# Patient Record
Sex: Male | Born: 1990 | Race: White | Hispanic: No | Smoking: Never smoker
Health system: Southern US, Community
[De-identification: ages and names within clinical notes are randomized; demographics above are authoritative.]

## PROBLEM LIST (undated history)

## (undated) DIAGNOSIS — J45909 Unspecified asthma, uncomplicated: Secondary | ICD-10-CM

## (undated) HISTORY — PX: HERNIA REPAIR: SHX51

## (undated) HISTORY — PX: FRACTURE SURGERY: SHX138

## (undated) HISTORY — PX: PILONIDAL CYST EXCISION: SHX744

---
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
## 2010-08-31 ENCOUNTER — Emergency Department: Payer: Self-pay | Admitting: Emergency Medicine

## 2010-09-01 ENCOUNTER — Ambulatory Visit: Payer: Self-pay | Admitting: Internal Medicine

## 2010-09-01 ENCOUNTER — Emergency Department: Payer: Self-pay | Admitting: Surgery

## 2010-09-02 ENCOUNTER — Ambulatory Visit: Payer: Self-pay | Admitting: Internal Medicine

## 2011-12-14 ENCOUNTER — Emergency Department: Payer: Self-pay | Admitting: Emergency Medicine

## 2011-12-14 LAB — URINALYSIS, COMPLETE
Bacteria: NONE SEEN
Blood: NEGATIVE
Glucose,UR: NEGATIVE mg/dL (ref 0–75)
Nitrite: NEGATIVE
Protein: 30
Specific Gravity: 1.03 (ref 1.003–1.030)
Squamous Epithelial: <1
WBC UR: 1 /HPF (ref 0–5)

## 2011-12-14 LAB — LIPASE, BLOOD: Lipase: 63 U/L — ABNORMAL LOW (ref 73–393)

## 2011-12-14 LAB — COMPREHENSIVE METABOLIC PANEL
Albumin: 4.3 g/dL (ref 3.4–5.0)
Anion Gap: 9 (ref 7–16)
BUN: 11 mg/dL (ref 7–18)
Calcium, Total: 9.2 mg/dL (ref 8.5–10.1)
Chloride: 105 mmol/L (ref 98–107)
EGFR (African American): >60
EGFR (Non-African Amer.): >60
Glucose: 104 mg/dL — ABNORMAL HIGH (ref 65–99)
Potassium: 4.3 mmol/L (ref 3.5–5.1)
SGOT(AST): 32 U/L (ref 15–37)
Sodium: 139 mmol/L (ref 136–145)

## 2011-12-14 LAB — CBC
HCT: 48.8 % (ref 40.0–52.0)
HGB: 16.4 g/dL (ref 13.0–18.0)
MCHC: 33.6 g/dL (ref 32.0–36.0)
MCV: 93 fL (ref 80–100)
Platelet: 379 10*3/uL (ref 150–440)
RBC: 5.25 10*6/uL (ref 4.40–5.90)
RDW: 13.7 % (ref 11.5–14.5)
WBC: 12.6 10*3/uL — ABNORMAL HIGH (ref 3.8–10.6)

## 2011-12-20 LAB — CULTURE, BLOOD (SINGLE)

## 2012-01-22 ENCOUNTER — Ambulatory Visit: Payer: Self-pay | Admitting: Family Medicine

## 2012-07-22 ENCOUNTER — Emergency Department: Payer: Self-pay | Admitting: Emergency Medicine

## 2013-04-14 ENCOUNTER — Ambulatory Visit: Payer: Self-pay | Admitting: Emergency Medicine

## 2013-04-14 LAB — URINALYSIS, COMPLETE
BILIRUBIN, UR: NEGATIVE
Blood: NEGATIVE
Glucose,UR: NEGATIVE mg/dL (ref 0–75)
KETONE: NEGATIVE
Leukocyte Esterase: NEGATIVE
Nitrite: NEGATIVE
Ph: 7 (ref 4.5–8.0)
Specific Gravity: 1.015 (ref 1.003–1.030)
Squamous Epithelial: NONE SEEN

## 2013-06-08 ENCOUNTER — Ambulatory Visit: Payer: Self-pay

## 2013-12-23 ENCOUNTER — Ambulatory Visit: Payer: Self-pay | Admitting: Podiatry

## 2014-01-04 ENCOUNTER — Ambulatory Visit (INDEPENDENT_AMBULATORY_CARE_PROVIDER_SITE_OTHER): Payer: BLUE CROSS/BLUE SHIELD | Admitting: Podiatry

## 2014-01-04 ENCOUNTER — Ambulatory Visit: Payer: Self-pay | Admitting: Podiatry

## 2014-01-04 ENCOUNTER — Encounter: Payer: Self-pay | Admitting: Podiatry

## 2014-01-04 VITALS — BP 133/83 | HR 101 | Resp 16

## 2014-01-04 DIAGNOSIS — B079 Viral wart, unspecified: Secondary | ICD-10-CM

## 2014-01-04 DIAGNOSIS — B078 Other viral warts: Secondary | ICD-10-CM

## 2014-01-04 NOTE — Progress Notes (Signed)
   Subjective:    Patient ID: Logan King, male    DOB: 31-May-1990, 24 y.o.   MRN: [id number]  HPI Comments: "I have a little wart"  Patient c/o tender plantar forefoot right for few months. Its getting larger. Sore with walking. No home treatment.      Review of Systems  Constitutional: Positive for appetite change and fatigue.  Musculoskeletal: Positive for myalgias and gait problem.  Neurological: Positive for numbness.  All other systems reviewed and are negative.      Objective:   Physical Exam        Assessment & Plan:

## 2014-01-06 NOTE — Progress Notes (Signed)
Subjective:     Patient ID: Logan King, male   DOB: [birth year], 24 y.o.   MRN: [id number]  HPI patient presents with 3 small lesions on the plantar aspect of the right foot that are painful with walking and he has not been able to apply anything at home. States that they have presented over the last several months   Review of Systems  All other systems reviewed and are negative.      Objective:   Physical Exam  Constitutional: He is oriented to person, place, and time.  Cardiovascular: Intact distal pulses.   Musculoskeletal: Normal range of motion.  Neurological: He is oriented to person, place, and time.  Skin: Skin is warm.  Nursing note and vitals reviewed.  neurovascular status intact with muscle strength adequate and noted to have 3 small lesions upon debridement that show pinpoint bleeding right with pain to lateral pressure. Patient's overall health is good     Assessment:     Verruca plantaris plantar aspect right foot    Plan:     Reviewed condition and debrided lesions fully and applied chemical and sterile dressing. Instructed what to do if any blistering should occur and reappoint 4 weeks earlier if any issues should occur

## 2014-02-01 ENCOUNTER — Ambulatory Visit: Payer: Self-pay | Admitting: Podiatry

## 2014-02-05 ENCOUNTER — Ambulatory Visit: Payer: Self-pay | Admitting: Podiatry

## 2014-06-08 ENCOUNTER — Encounter (HOSPITAL_COMMUNITY): Payer: Self-pay | Admitting: Physical Medicine and Rehabilitation

## 2014-06-08 ENCOUNTER — Emergency Department (HOSPITAL_COMMUNITY): Payer: BLUE CROSS/BLUE SHIELD

## 2014-06-08 ENCOUNTER — Emergency Department (HOSPITAL_COMMUNITY)
Admission: EM | Admit: 2014-06-08 | Discharge: 2014-06-09 | Discharge status: Against Medical Advice | Payer: BLUE CROSS/BLUE SHIELD | Source: Home / Self Care | Attending: Emergency Medicine | Admitting: Emergency Medicine

## 2014-06-08 DIAGNOSIS — F909 Attention-deficit hyperactivity disorder, unspecified type: Secondary | ICD-10-CM | POA: Diagnosis not present

## 2014-06-08 DIAGNOSIS — R0602 Shortness of breath: Secondary | ICD-10-CM

## 2014-06-08 DIAGNOSIS — R42 Dizziness and giddiness: Secondary | ICD-10-CM | POA: Insufficient documentation

## 2014-06-08 DIAGNOSIS — R079 Chest pain, unspecified: Secondary | ICD-10-CM

## 2014-06-08 DIAGNOSIS — F419 Anxiety disorder, unspecified: Secondary | ICD-10-CM | POA: Diagnosis not present

## 2014-06-08 DIAGNOSIS — R1110 Vomiting, unspecified: Secondary | ICD-10-CM

## 2014-06-08 DIAGNOSIS — R002 Palpitations: Secondary | ICD-10-CM

## 2014-06-08 DIAGNOSIS — Z79899 Other long term (current) drug therapy: Secondary | ICD-10-CM | POA: Diagnosis not present

## 2014-06-08 LAB — CBC WITH DIFFERENTIAL/PLATELET
BASOS ABS: 0 10*3/uL (ref 0.0–0.1)
BASOS PCT: 0 % (ref 0–1)
EOS ABS: 0.5 10*3/uL (ref 0.0–0.7)
Eosinophils Relative: 5 % (ref 0–5)
HCT: 41.9 % (ref 39.0–52.0)
Hemoglobin: 14.3 g/dL (ref 13.0–17.0)
LYMPHS ABS: 1.5 10*3/uL (ref 0.7–4.0)
Lymphocytes Relative: 16 % (ref 12–46)
MCH: 30.2 pg (ref 26.0–34.0)
MCHC: 34.1 g/dL (ref 30.0–36.0)
MCV: 88.4 fL (ref 78.0–100.0)
MONO ABS: 0.8 10*3/uL (ref 0.1–1.0)
MONOS PCT: 9 % (ref 3–12)
NEUTROS ABS: 6.6 10*3/uL (ref 1.7–7.7)
NEUTROS PCT: 70 % (ref 43–77)
PLATELETS: 289 10*3/uL (ref 150–400)
RBC: 4.74 MIL/uL (ref 4.22–5.81)
RDW: 12.4 % (ref 11.5–15.5)
WBC: 9.5 10*3/uL (ref 4.0–10.5)

## 2014-06-08 LAB — COMPREHENSIVE METABOLIC PANEL
ALBUMIN: 4.1 g/dL (ref 3.5–5.0)
ALK PHOS: 103 U/L (ref 38–126)
ALT: 20 U/L (ref 17–63)
AST: 21 U/L (ref 15–41)
Anion gap: 11 (ref 5–15)
BILIRUBIN TOTAL: 0.5 mg/dL (ref 0.3–1.2)
BUN: 10 mg/dL (ref 6–20)
CALCIUM: 9.4 mg/dL (ref 8.9–10.3)
CHLORIDE: 103 mmol/L (ref 101–111)
CO2: 26 mmol/L (ref 22–32)
Creatinine, Ser: 1.19 mg/dL (ref 0.61–1.24)
Glucose, Bld: 95 mg/dL (ref 65–99)
Potassium: 3.8 mmol/L (ref 3.5–5.1)
SODIUM: 140 mmol/L (ref 135–145)
Total Protein: 6.5 g/dL (ref 6.5–8.1)

## 2014-06-08 LAB — I-STAT TROPONIN, ED: TROPONIN I, POC: 0 ng/mL (ref 0.00–0.08)

## 2014-06-08 LAB — D-DIMER, QUANTITATIVE (NOT AT ARMC)

## 2014-06-08 MED ORDER — PANTOPRAZOLE SODIUM 20 MG PO TBEC: 20.0000 mg | DELAYED_RELEASE_TABLET | Freq: Every day | ORAL | Status: DC

## 2014-06-08 MED ORDER — SODIUM CHLORIDE 0.9 % IV BOLUS (SEPSIS): 1000.0000 mL | Freq: Once | INTRAVENOUS | Status: DC

## 2014-06-08 MED ORDER — GI COCKTAIL ~~LOC~~
30.0000 mL | Freq: Once | ORAL | Status: AC
  Administered 2014-06-08: 30 mL via ORAL
  Filled 2014-06-08: qty 30

## 2014-06-08 MED ORDER — IBUPROFEN 800 MG PO TABS
800.0000 mg | ORAL_TABLET | Freq: Three times a day (TID) | ORAL | Status: DC
Start: 2014-06-08 — End: 2014-06-09

## 2014-06-08 NOTE — Discharge Instructions (Signed)

## 2014-06-08 NOTE — ED Provider Notes (Signed)
CSN: [id number]     Arrival date & time 06/08/14  1812 History   First MD Initiated Contact with Patient 06/08/14 2018     Chief Complaint  Patient presents with  . Chest Pain  . Shortness of Breath    (Consider location/radiation/quality/duration/timing/severity/associated sxs/prior Treatment) HPI Comments: 24 year old male with no significant past medical history presents to the emergency department for further evaluation of chest pain. Patient states that chest pain began at 0345 this morning and has been constant since onset. Pain began shortly after waking from sleep at 3 AM. Patient describes the pain as pressure which radiates through to his back and to his b/l shoulders. He took tums for symptoms with no relief. He states that pain is worse with exertion and that when he bends over he feels like "it cuts off my air" and he cannot breathe. He reports mild SOB at rest and dizziness. Girlfriend reports some pallor initially which has resolved. He had 1 episode of emesis shortly after his pain began. No diaphoresis or syncope. No PMH of HTN, HLD, DM, PE/DVT. No FHx of sudden cardiac death or ACS. Patient endorses a PMHx of anxiety which has always been mild and never required medication. He reports drinking 2 alcoholic drinks per week. Denies marijuana and illicit drug use.  Patient is a 24 y.o. male presenting with chest pain and shortness of breath. The history is provided by the patient. No language interpreter was used.  Chest Pain Associated symptoms: dizziness, palpitations, shortness of breath and vomiting   Associated symptoms: no abdominal pain and no fever   Shortness of Breath Associated symptoms: chest pain and vomiting   Associated symptoms: no abdominal pain and no fever     History reviewed. No pertinent past medical history. History reviewed. No pertinent past surgical history. History reviewed. No pertinent family history. History  Substance Use Topics  . Smoking status:  Never Smoker   . Smokeless tobacco: Not on file  . Alcohol Use: 0.0 oz/week    0 Standard drinks or equivalent per week    Review of Systems  Constitutional: Negative for fever.  Respiratory: Positive for shortness of breath.   Cardiovascular: Positive for chest pain and palpitations.  Gastrointestinal: Positive for vomiting. Negative for abdominal pain.  Skin: Positive for pallor.  Neurological: Positive for dizziness. Negative for syncope.  All other systems reviewed and are negative.   Allergies  Review of patient's allergies indicates no known allergies.  Home Medications   Prior to Admission medications   Medication Sig Start Date End Date Taking? Authorizing Provider  ibuprofen (ADVIL,MOTRIN) 800 MG tablet Take 1 tablet (800 mg total) by mouth 3 (three) times daily. 06/08/14   Antony MaduraKelly Elver Stadler, PA-C  pantoprazole (PROTONIX) 20 MG tablet Take 1 tablet (20 mg total) by mouth daily. 06/08/14   Antony MaduraKelly Roise Emert, PA-C   BP 124/82 mmHg  Pulse 77  Temp(Src) 98.5 F (36.9 C) (Oral)  Resp 12  SpO2 98%   Physical Exam  Constitutional: He is oriented to person, place, and time. He appears well-developed and well-nourished. No distress.  Nontoxic/nonseptic appearing  HENT:  Head: Normocephalic and atraumatic.  Eyes: Conjunctivae and EOM are normal. No scleral icterus.  Neck: Normal range of motion.  No JVD  Cardiovascular: Normal rate, regular rhythm and intact distal pulses.   Pulmonary/Chest: Effort normal and breath sounds normal. No respiratory distress. He has no wheezes. He has no rales.  Respirations even and unlabored  Abdominal: Soft. He exhibits no distension.  There is tenderness. There is no rebound and no guarding.  Mild tenderness to palpation in the epigastric abdomen  Musculoskeletal: Normal range of motion.  Neurological: He is alert and oriented to person, place, and time. He exhibits normal muscle tone. Coordination normal.  GCS 15. Patient moves extremities without  ataxia.  Skin: Skin is warm and dry. No rash noted. He is not diaphoretic. No erythema. No pallor.  Psychiatric: He has a normal mood and affect. His behavior is normal.  Nursing note and vitals reviewed.   ED Course  Procedures (including critical care time) Labs Review Labs Reviewed  CBC WITH DIFFERENTIAL/PLATELET  COMPREHENSIVE METABOLIC PANEL  D-DIMER, QUANTITATIVE (NOT AT [facility])  BRAIN NATRIURETIC PEPTIDE  I-STAT TROPOININ, ED    Imaging Review Dg Chest 2 View  06/08/2014   CLINICAL DATA:  Chest pain and shortness of breath today.  EXAM: CHEST  2 VIEW  COMPARISON:  None.  FINDINGS: Normal heart size and mediastinal contours. No acute infiltrate or edema. No effusion or pneumothorax. No acute osseous findings.  IMPRESSION: Negative chest.   Electronically Signed   By: Marnee Spring M.D.   On: 06/08/2014 19:41     EKG Interpretation   Date/Time:  Tuesday June 08 2014 18:19:35 EDT Ventricular Rate:  101 PR Interval:  114 QRS Duration: 92 QT Interval:  322 QTC Calculation: 417 R Axis:   86 Text Interpretation:  Sinus tachycardia Otherwise normal ECG s1q3t3  pattern seen No old tracing to compare Confirmed by Rhunette Croft, MD, Janey Genta  [phone]) on 06/08/2014 9:35:31 PM      MDM   Final diagnoses:  Chest pain, unspecified chest pain type  Chest pain    24 year old male presents to the emergency department for further evaluation of chest pain. Chest pain began at 0345 this morning and has been constant throughout the day. Patient has no risk factors for ACS. No family history of ACS. His cardiac workup today is reassuring with a negative troponin and nonischemic EKG; troponin obtained 15 hours following onset of constant chest pain. There is evidence to suggest potential right heart strain with S1Q3T3 on EKG; however dimer today is negative and patient exhibits no tachypnea, dyspnea, or hypoxia. No risk factors for PE. Doubt dissection. CXR reviewed by myself; negative for PTX, PNA,  free air, or evidence of mediastinal widening.  Patient with no improvement in symptoms with GI cocktail. Question MSK etiology vs anxiety as patient does report some history of this. Pericarditis considered; no diffuse ST elevation on EKG, but will cover with high dose NSAIDs should this be contributing to patient's symptoms today.  Patient seen and evaluated in the emergency department by my attending, Dr. Rhunette Croft. Dr. Rhunette Croft recommended further evaluation with CT angiogram given concerning story surrounding patient's chest pain. Patient initially agreed to this test, later declining. Patient with the capacity to make this decision, understanding the risks associated with an incomplete work up. He was discharged AMA at this time. Referral given to [facility] cardiology for further follow-up, especially if symptoms persist.   Filed Vitals:   06/08/14 1827 06/09/14 0019  BP: 123/71 124/82  Pulse: 96 77  Temp: 98.6 F (37 C) 98.5 F (36.9 C)  TempSrc: Oral Oral  Resp: 20 12  SpO2: 97% 98%      Antony Madura, PA-C 06/09/14 0347  Derwood Kaplan, MD 06/10/14 [phone]

## 2014-06-08 NOTE — ED Notes (Signed)
Pt presents to department for evaluation of midsternal chest pressure radiating to both arms. 5/10 pain upon arrival to ED. Pt reports he became sweaty, nauseated and felt like passing out when chest pain started. Respirations unlabored. Pt is alert and oriented x4.

## 2014-06-09 ENCOUNTER — Observation Stay (HOSPITAL_COMMUNITY)
Admission: EM | Admit: 2014-06-09 | Discharge: 2014-06-10 | Disposition: A | Discharge status: Home / Self Care | Payer: BLUE CROSS/BLUE SHIELD | Attending: Internal Medicine | Admitting: Internal Medicine

## 2014-06-09 ENCOUNTER — Emergency Department (HOSPITAL_COMMUNITY): Payer: BLUE CROSS/BLUE SHIELD

## 2014-06-09 ENCOUNTER — Encounter (HOSPITAL_COMMUNITY): Payer: Self-pay | Admitting: *Deleted

## 2014-06-09 DIAGNOSIS — F1290 Cannabis use, unspecified, uncomplicated: Secondary | ICD-10-CM

## 2014-06-09 DIAGNOSIS — R002 Palpitations: Secondary | ICD-10-CM | POA: Insufficient documentation

## 2014-06-09 DIAGNOSIS — R079 Chest pain, unspecified: Secondary | ICD-10-CM | POA: Diagnosis not present

## 2014-06-09 DIAGNOSIS — R0781 Pleurodynia: Secondary | ICD-10-CM | POA: Diagnosis not present

## 2014-06-09 DIAGNOSIS — F419 Anxiety disorder, unspecified: Secondary | ICD-10-CM | POA: Insufficient documentation

## 2014-06-09 DIAGNOSIS — Z79899 Other long term (current) drug therapy: Secondary | ICD-10-CM | POA: Insufficient documentation

## 2014-06-09 DIAGNOSIS — F909 Attention-deficit hyperactivity disorder, unspecified type: Secondary | ICD-10-CM | POA: Insufficient documentation

## 2014-06-09 LAB — I-STAT TROPONIN, ED: Troponin i, poc: 0 ng/mL (ref 0.00–0.08)

## 2014-06-09 LAB — I-STAT CHEM 8, ED
BUN: 16 mg/dL (ref 6–20)
CHLORIDE: 103 mmol/L (ref 101–111)
CREATININE: 1.1 mg/dL (ref 0.61–1.24)
Calcium, Ion: 1.19 mmol/L (ref 1.12–1.23)
Glucose, Bld: 102 mg/dL — ABNORMAL HIGH (ref 65–99)
HCT: 48 % (ref 39.0–52.0)
Hemoglobin: 16.3 g/dL (ref 13.0–17.0)
Potassium: 3.6 mmol/L (ref 3.5–5.1)
Sodium: 141 mmol/L (ref 135–145)
TCO2: 22 mmol/L (ref 0–100)

## 2014-06-09 LAB — RAPID URINE DRUG SCREEN, HOSP PERFORMED
AMPHETAMINES: NOT DETECTED
Barbiturates: NOT DETECTED
Benzodiazepines: NOT DETECTED
COCAINE: NOT DETECTED
OPIATES: NOT DETECTED
Tetrahydrocannabinol: POSITIVE — AB

## 2014-06-09 LAB — BRAIN NATRIURETIC PEPTIDE: B NATRIURETIC PEPTIDE 5: 47.3 pg/mL (ref 0.0–100.0)

## 2014-06-09 MED ORDER — AMPHETAMINE-DEXTROAMPHETAMINE 10 MG PO TABS
5.0000 mg | ORAL_TABLET | Freq: Every day | ORAL | Status: DC
  Administered 2014-06-10: 5 mg via ORAL
  Filled 2014-06-09: qty 1

## 2014-06-09 MED ORDER — ONDANSETRON HCL 4 MG/2ML IJ SOLN: 4.0000 mg | Freq: Four times a day (QID) | INTRAMUSCULAR | Status: DC | PRN

## 2014-06-09 MED ORDER — ACETAMINOPHEN 325 MG PO TABS: 650.0000 mg | ORAL_TABLET | ORAL | Status: DC | PRN

## 2014-06-09 MED ORDER — ASPIRIN 81 MG PO CHEW
324.0000 mg | CHEWABLE_TABLET | Freq: Once | ORAL | Status: AC
  Administered 2014-06-09: 324 mg via ORAL
  Filled 2014-06-09: qty 4

## 2014-06-09 MED ORDER — HEPARIN SODIUM (PORCINE) 5000 UNIT/ML IJ SOLN
5000.0000 [IU] | Freq: Three times a day (TID) | INTRAMUSCULAR | Status: DC
  Filled 2014-06-09: qty 1

## 2014-06-09 MED ORDER — MORPHINE SULFATE 2 MG/ML IJ SOLN
2.0000 mg | Freq: Once | INTRAMUSCULAR | Status: AC
  Administered 2014-06-09: 2 mg via INTRAVENOUS
  Filled 2014-06-09: qty 1

## 2014-06-09 MED ORDER — ASPIRIN 325 MG PO TABS
325.0000 mg | ORAL_TABLET | Freq: Every day | ORAL | Status: DC
  Administered 2014-06-10: 325 mg via ORAL
  Filled 2014-06-09: qty 1

## 2014-06-09 MED ORDER — MORPHINE SULFATE 2 MG/ML IJ SOLN
1.0000 mg | INTRAMUSCULAR | Status: DC | PRN
  Administered 2014-06-09 – 2014-06-10 (×4): 1 mg via INTRAVENOUS
  Filled 2014-06-09 (×4): qty 1

## 2014-06-09 MED ORDER — GI COCKTAIL ~~LOC~~
30.0000 mL | Freq: Four times a day (QID) | ORAL | Status: DC | PRN
  Administered 2014-06-09: 30 mL via ORAL
  Filled 2014-06-09: qty 30

## 2014-06-09 MED ORDER — COLCHICINE 0.6 MG PO TABS
0.6000 mg | ORAL_TABLET | Freq: Two times a day (BID) | ORAL | Status: DC
  Administered 2014-06-09 – 2014-06-10 (×2): 0.6 mg via ORAL
  Filled 2014-06-09 (×2): qty 1

## 2014-06-09 NOTE — H&P (Signed)
[facility] History and Physical  Logan King WGY:[id number] DOB: 12-10-1990 DOA: 06/09/2014   PCP: Does not have a PCP  Specialists: None  Chief Complaint: Chest pain since yesterday  HPI: Logan King is a 24 y.o. male with a past medical history that is unremarkable, who was in his usual state of health yesterday morning when he started developing chest pain in the center of his chest and to the left. There are 2 components to the pain, dull pain as well as a sharp pain. The dull pain was persistent, continuous and the sharp pain would come and go. Would increase with breathing in deep. At its worst the pain was 8 out of 10 in intensity. He denies any cough. No fever, no nausea, vomiting. No recent illness. He did get lightheaded. No syncopal episodes. Had noticed palpitations. Did get short of breath as well. He's never had similar symptoms before. He denies any cocaine use currently, although he did use marijuana about 2 days ago. Denies previous such symptoms. Currently, pain is 5-10 in intensity.  Home Medications: Prior to Admission medications   Medication Sig Start Date End Date Taking? Authorizing Provider  amphetamine-dextroamphetamine (ADDERALL) 10 MG tablet Take 5 mg by mouth daily.  03/30/14  Yes Historical Provider, MD    Allergies: No Known Allergies  Past Medical History: History reviewed. No pertinent past medical history.  Past Surgical History  Procedure Laterality Date  . Hernia repair    . Pilonidal cyst excision      Social History: He lives in [place] with his fiance. He works for [employer]. He denies any history of smoking or alcohol use. He used to do cocaine many months ago, but none recently. Did do marijuana 2 days ago. Independent with daily activities.  Family History:  Family History  Problem Relation Age of Onset  .      denies any health problems in his family.  Review of Systems - History obtained from the patient General  ROS: negative Psychological ROS: negative Ophthalmic ROS: negative ENT ROS: negative Allergy and Immunology ROS: negative Hematological and Lymphatic ROS: negative Endocrine ROS: negative Respiratory ROS: As in history of present illness Cardiovascular ROS: As in history of present illness Gastrointestinal ROS: no abdominal pain, change in bowel habits, or black or bloody stools Genito-Urinary ROS: no dysuria, trouble voiding, or hematuria Musculoskeletal ROS: negative Neurological ROS: no TIA or stroke symptoms Dermatological ROS: negative  Physical Examination  Filed Vitals:   06/09/14 1800 06/09/14 1915 06/09/14 2000 06/09/14 2018  BP: 112/73 126/82 117/95 123/72  Pulse: 96 93 95 93  Temp:  98.3 F (36.8 C)  98.2 F (36.8 C)  TempSrc:  Oral  Oral  Resp: _0 Height:      Weight:      SpO2: 95% 98% 95% 98%    BP 123/72 mmHg  Pulse 93  Temp(Src) 98.2 F (36.8 C) (Oral)  Resp 22  Ht _1  (1.803 m)  Wt 88.451 kg (195 lb)  BMI 27.21 kg/m2  SpO2 98%  General appearance: alert, cooperative, appears stated age and no distress Head: Normocephalic, without obvious abnormality, atraumatic Eyes: conjunctivae/corneas clear. PERRL, EOM's intact. Neck is soft and supple. No thyromegaly. Throat: lips, mucosa, and tongue normal; teeth and gums normal Resp: clear to auscultation bilaterally Cardio: regular rate and rhythm, S1, S2 normal, no murmur, click, rub or gallop GI: soft, non-tender; bowel sounds normal; no masses,  no organomegaly Extremities: extremities normal, atraumatic,  no cyanosis or edema Pulses: 2+ and symmetric Skin: Skin color, texture, turgor normal. No rashes or lesions Lymph nodes: Cervical, supraclavicular, and axillary nodes normal. Neurologic: No focal deficits  Laboratory Data: Results for orders placed or performed during the hospital encounter of 06/09/14 (from the past 48 hour(s))  I-Stat Chem 8, ED     Status: Abnormal   Collection  Time: 06/09/14  2:43 PM  Result Value Ref Range   Sodium 141 135 - 145 mmol/L   Potassium 3.6 3.5 - 5.1 mmol/L   Chloride 103 101 - 111 mmol/L   BUN 16 6 - 20 mg/dL   Creatinine, Ser 1.10 0.61 - 1.24 mg/dL   Glucose, Bld 102 (H) 65 - 99 mg/dL   Calcium, Ion 1.19 1.12 - 1.23 mmol/L   TCO2 22 0 - 100 mmol/L   Hemoglobin 16.3 13.0 - 17.0 g/dL   HCT 48.0 39.0 - 52.0 %  I-stat troponin, ED     Status: None   Collection Time: 06/09/14  4:39 PM  Result Value Ref Range   Troponin i, poc 0.00 0.00 - 0.08 ng/mL   Comment 3            Comment: Due to the release kinetics of cTnI, a negative result within the first hours of the onset of symptoms does not rule out myocardial infarction with certainty. If myocardial infarction is still suspected, repeat the test at appropriate intervals.   Urine rapid drug screen (hosp performed)not at [facility]     Status: Abnormal   Collection Time: 06/09/14  8:00 PM  Result Value Ref Range   Opiates NONE DETECTED NONE DETECTED   Cocaine NONE DETECTED NONE DETECTED   Benzodiazepines NONE DETECTED NONE DETECTED   Amphetamines NONE DETECTED NONE DETECTED   Tetrahydrocannabinol POSITIVE (A) NONE DETECTED   Barbiturates NONE DETECTED NONE DETECTED    Comment:        DRUG SCREEN FOR MEDICAL PURPOSES ONLY.  IF CONFIRMATION IS NEEDED FOR ANY PURPOSE, NOTIFY LAB WITHIN 5 DAYS.        LOWEST DETECTABLE LIMITS FOR URINE DRUG SCREEN Drug Class       Cutoff (ng/mL) Amphetamine      1000 Barbiturate      200 Benzodiazepine   786 Tricyclics       754 Opiates          300 Cocaine          300 THC              50     Radiology Reports: Dg Chest 2 View  06/08/2014   CLINICAL DATA:  Chest pain and shortness of breath today.  EXAM: CHEST  2 VIEW  COMPARISON:  None.  FINDINGS: Normal heart size and mediastinal contours. No acute infiltrate or edema. No effusion or pneumothorax. No acute osseous findings.  IMPRESSION: Negative chest.   Electronically Signed   By:  Monte Fantasia M.D.   On: 06/08/2014 19:41    Electrocardiogram: Sinus tachycardia at 135 beats a minute. Normal axis. Normal intervals. Possible T inversions in leads 3 and aVF. No Q waves. No concerning ST changes.  Problem List  Principal Problem:   Chest pain   Assessment: This is a 24 year old Caucasian male with a past medical history that is unremarkable, who presents with pleuritic chest pain. He does have nonspecific EKG findings. Initially was tachycardic but now heart rate is normal. D-dimer is normal. This could be pericarditis or pleurisy. Cardiology has  seen the patient and recommends echocardiogram and observation.  Plan: #1 chest pain, pleuritic: Etiology is unclear. His urine drug screen is negative for cocaine. His EKG shows nonspecific changes. D-dimer is normal. Seen by cardiology and they recommended echocardiogram and overnight observation. The also recommend colchicine, which will be initiated. We will check ESR and CRP. Repeat EKG in the morning. Continue to cycle troponins. Chest x-ray was unremarkable. Blood pressure is not elevated. Check TSH.  #2 Marijuana use: He has been told to avoid recreational drugs.   DVT Prophylaxis: Heparin subcutaneously Code Status: Full code Family Communication: Discussed with the patient and his fiance  Disposition Plan: Observe to telemetry.   Further management decisions will depend on results of further testing and patient's response to treatment.   [facility]  [facility] Pager [phone]  If 7PM-7AM, please contact night-coverage [email or web address] Password [facility]  06/09/2014, 9:27 PM

## 2014-06-09 NOTE — ED Notes (Signed)
Logan King states to give 800 mg of naproxen but pt states he has at home and does not want it

## 2014-06-09 NOTE — ED Notes (Signed)
Admitting MD at bedside.

## 2014-06-09 NOTE — ED Notes (Signed)
Pt reports seen last night for chest pain. Told to come back if it continued. HR found to be in 140's in triage. Reports pain as a pressure, reports some palpitations. Shortness of breath. States MD wanted to do CT Chest last night, but pt left and told to come back for CT if continued.

## 2014-06-09 NOTE — Consult Note (Signed)
Patient ID: Logan King MRN: [id number], DOB/AGE: [birth year]   Admit date: 06/09/2014   Primary Physician: No PCP Per Patient Primary Cardiologist: New  Pt. Profile:  24 year old male with no prior cardiac history but remote history of polysubstance abuse presenting with chest pain  Problem List  History reviewed. No pertinent past medical history.  History reviewed. No pertinent past surgical history.   Allergies  No Known Allergies  HPI  Patient is a 24 year old male with no prior cardiac history who [facility] complaints of chest pain. He does admit to a remote history of polysubstance abuse including cocaine but he denies any recent use. No history of hypertension, diabetes, hyperlipidemia or tobacco abuse. He denies any family history of CAD or sudden cardiac death. He notes past medical history significant for asthma and anxiety.  He reports a 2 day history of left-sided chest pressure radiating to the left upper extremity. Also notes associated dyspnea, diaphoresis, nausea, vomiting and palpitations. No syncope/near-syncope. Pain is pleuritic and also exertional. He denies any recent prolonged travel. No lower extremity pain or swelling. He does admit to trauma to his left upper extremity about 5 days ago when he punched a wall with his left hand. He believes he may have fractured a couple of his digits but this has not been confirmed by x-ray. However he denies any significant swelling or erythema of the left upper extremity. His EKG on arrival demonstrated sinus tachycardia with a heart rate of 135 bpm and right axis deviation. Of note, he was also seen in the emergency department last night and his EKG demonstrated sinus tach and was also concerning for s1q3t3 pattern. D-dimer was negative however it was recommended that he undergo a CT to rule out PE however the patient left AMA. Due to worsening pain he presented back for repeat evaluation. Nothing  has relieved his pain including no relief with GI cocktail. He continues to have discomfort most notably with deep inspiration. Troponin is negative. He denies any recent cocaine use. No recent excessive caffeine, alcohol or other stimulant intake.    Home Medications  Prior to Admission medications   Medication Sig Start Date End Date Taking? Authorizing Provider  amphetamine-dextroamphetamine (ADDERALL) 10 MG tablet Take 5 mg by mouth daily.  03/30/14  Yes Historical Provider, MD    Family History - negative for CAD and SCD   Social History  History   Social History  . Marital Status: Significant Other    Spouse Name: N/A  . Number of Children: N/A  . Years of Education: N/A   Occupational History  . Not on file.   Social History Main Topics  . Smoking status: Never Smoker   . Smokeless tobacco: Not on file  . Alcohol Use: 0.0 oz/week    0 Standard drinks or equivalent per week  . Drug Use: Not on file  . Sexual Activity: Not on file   Other Topics Concern  . Not on file   Social History Narrative     Review of Systems General:  No chills, fever, night sweats or weight changes.  Cardiovascular:  No chest pain, dyspnea on exertion, edema, orthopnea, palpitations, paroxysmal nocturnal dyspnea. Dermatological: No rash, lesions/masses Respiratory: No cough, dyspnea Urologic: No hematuria, dysuria Abdominal:   No nausea, vomiting, diarrhea, bright red blood per rectum, melena, or hematemesis Neurologic:  No visual changes, wkns, changes in mental status. All other systems reviewed and are otherwise negative except as noted  above.  Physical Exam  Blood pressure 100/62, pulse 76, temperature 98 F (36.7 C), temperature source Oral, resp. rate 18, height  (1.803 m), weight 195 lb (88.451 kg), SpO2 96 %.  General: Pleasant, NAD Psych: Normal affect. Neuro: Alert and oriented X 3. Moves all extremities spontaneously. HEENT: Normal  Neck: Supple without bruits  or JVD. Lungs:  Resp regular and unlabored, CTA. Heart: RRR no s3, s4, or murmurs. Abdomen: Soft, non-tender, non-distended, BS + x 4.  Extremities: No clubbing, cyanosis or edema. DP/PT/Radials 2+ and equal bilaterally.  Labs  Troponin [facility] of Care Test)  Recent Labs  06/09/14 1639  TROPIPOC 0.00   No results for input(s): CKTOTAL, CKMB, TROPONINI in the last 72 hours. Lab Results  Component Value Date   WBC 9.5 06/08/2014   HGB 16.3 06/09/2014   HCT 48.0 06/09/2014   MCV 88.4 06/08/2014   PLT 289 06/08/2014    Recent Labs Lab 06/08/14 1820 06/09/14 1443  NA 140 141  K 3.8 3.6  CL 103 103  CO2 26  --   BUN 10 16  CREATININE 1.19 1.10  CALCIUM 9.4  --   PROT 6.5  --   BILITOT 0.5  --   ALKPHOS 103  --   ALT 20  --   AST 21  --   GLUCOSE 95 102*   No results found for: CHOL, HDL, LDLCALC, TRIG Lab Results  Component Value Date   DDIMER <0.27 06/08/2014     Radiology/Studies  Dg Chest 2 View  06/08/2014   CLINICAL DATA:  Chest pain and shortness of breath today.  EXAM: CHEST  2 VIEW  COMPARISON:  None.  FINDINGS: Normal heart size and mediastinal contours. No acute infiltrate or edema. No effusion or pneumothorax. No acute osseous findings.  IMPRESSION: Negative chest.   Electronically Signed   By: Marnee Spring M.D.   On: 06/08/2014 19:41    ECG  Sinus tach; HR 135 bpm   ASSESSMENT AND PLAN  1. Chest pain: Given his age and little risk factors, doubtful this represents acute coronary syndrome. He denies any recent cocaine use. Troponin is negative.  He has pleuritic CP. ? Pericarditis. Recommend 2D echo and treatment with colchicine.   Signed, Robbie Lis, PA-C 06/09/2014, 5:25 PM

## 2014-06-09 NOTE — ED Provider Notes (Addendum)
CSN: [id number]     Arrival date & time 06/09/14  1403 History   First MD Initiated Contact with Patient 06/09/14 1513     Chief Complaint  Patient presents with  . Chest Pain     (Consider location/radiation/quality/duration/timing/severity/associated sxs/prior Treatment) HPI Comments: Logan King is a 24 y/o male with a history of anxiety, occasional cocaine use and ADHD who presents with chest pain. Logan King says he started feeling chest pain which woke him from sleep yesterday morning.  The pain is described as severe 9/10 chest "pressure" which was worse with deep inspiration.  The pain was sudden in onset and did not radiate.  Currently the pain is worse when laying supine.  The patient denies taking any medication for pain relief.  Patient presented yesterday evening to [facility] ED and left prior to CTA for r/o of aortic dissection (patient complained of CP radiating to back and weakness in his left arm).  Patient currently denies any pain radiating to the back.  Patient received tums which did not alleviate his pain.  Patient returned this afternoon after symptoms continued to persist.  Patient says his pain improved significantly after he heard a "pop" in his chest while in route to the hospital.  Patient currently rates his chest pain as 6/10.  Patient denies history of cardiac abnormalities or arrhythmia.  Patient has a history of ADHD and anxiety and does not regularly take medication for these conditions.  Patient denies family history of sudden cardiac death.  Patient endorses history of cocaine abuse 6-8x in the past few months.  Also endorses marijuana use in the past 24 hours.  Patient drinks 12 beers on weekends.  Denies history of IV drug abuse. No tobacco use.    ROS 10 Systems reviewed and are negative for acute change except as noted in the HPI.     Patient is a 24 y.o. male presenting with chest pain. The history is provided by the patient.  Chest Pain   History  reviewed. No pertinent past medical history. History reviewed. No pertinent past surgical history. Family History  Problem Relation Age of Onset  .      History  Substance Use Topics  . Smoking status: Never Smoker   . Smokeless tobacco: Not on file  . Alcohol Use: 0.0 oz/week    0 Standard drinks or equivalent per week    Review of Systems  Cardiovascular: Positive for chest pain.  All other systems reviewed and are negative.     Allergies  Review of patient's allergies indicates no known allergies.  Home Medications   Prior to Admission medications   Medication Sig Start Date End Date Taking? Authorizing Provider  amphetamine-dextroamphetamine (ADDERALL) 10 MG tablet Take 5 mg by mouth daily.  03/30/14  Yes Historical Provider, MD   BP 112/73 mmHg  Pulse 96  Temp(Src) 98 F (36.7 C) (Oral)  Resp 18  Ht  (1.803 m)  Wt 195 lb (88.451 kg)  BMI 27.21 kg/m2  SpO2 95% Physical Exam  Constitutional: He is oriented to person, place, and time. He appears well-developed.  HENT:  Head: Normocephalic and atraumatic.  Eyes: Conjunctivae and EOM are normal. Pupils are equal, round, and reactive to light.  Neck: Normal range of motion. Neck supple.  Cardiovascular: Normal rate, regular rhythm and intact distal pulses.  Exam reveals no friction rub.   No murmur heard. Pulmonary/Chest: Effort normal and breath sounds normal.  Abdominal: Soft. Bowel sounds are normal. He  exhibits no distension. There is no tenderness. There is no rebound and no guarding.  Neurological: He is alert and oriented to person, place, and time.  Skin: Skin is warm.  Nursing note and vitals reviewed.   ED Course  Procedures (including critical care time) Labs Review Labs Reviewed  I-STAT CHEM 8, ED - Abnormal; Notable for the following:    Glucose, Bld 102 (*)    All other components within normal limits  I-STAT TROPOININ, ED    Imaging Review Dg Chest 2 View  06/08/2014   CLINICAL DATA:   Chest pain and shortness of breath today.  EXAM: CHEST  2 VIEW  COMPARISON:  None.  FINDINGS: Normal heart size and mediastinal contours. No acute infiltrate or edema. No effusion or pneumothorax. No acute osseous findings.  IMPRESSION: Negative chest.   Electronically Signed   By: Marnee SpringJonathon  Watts M.D.   On: 06/08/2014 19:41     EKG Interpretation None     ED ECG REPORT   Date: 06/09/2014  Rate: 135  Rhythm: sinus tachycardia  QRS Axis: normal  Intervals: normal  ST/T Wave abnormalities: nonspecific ST/T changes, T wave inversions in the inferior leads  Conduction Disutrbances:none  Narrative Interpretation:   Old EKG Reviewed: unchanged  I have personally reviewed the EKG tracing and agree with the computerized printout as noted.   MDM   Final diagnoses:  Chest pain, unspecified chest pain type  Palpitations    Pt with atypical chest pain. Has cocaine hx, and is having some palpitations. Pain is atypical - positional, not exertional. Ddx: Pericarditis, Myocarditis, Dissection, ACS. Pt had a neg dimer yday - which effectively makes PE and Dissection highly unlikely Will get Cards to see him today - 2nd er visit for the same with neg trops already.   Derwood KaplanAnkit Heddy Vidana, MD 06/09/14 1732  6:26 PM Cards recommend obs admission with Tele, and an echo.   Derwood KaplanAnkit Rishi Vicario, MD 06/09/14 [id number]

## 2014-06-10 ENCOUNTER — Ambulatory Visit (HOSPITAL_COMMUNITY): Payer: BLUE CROSS/BLUE SHIELD

## 2014-06-10 DIAGNOSIS — F1220 Cannabis dependence, uncomplicated: Secondary | ICD-10-CM | POA: Diagnosis not present

## 2014-06-10 DIAGNOSIS — R079 Chest pain, unspecified: Secondary | ICD-10-CM

## 2014-06-10 DIAGNOSIS — R0789 Other chest pain: Secondary | ICD-10-CM | POA: Diagnosis not present

## 2014-06-10 DIAGNOSIS — F1290 Cannabis use, unspecified, uncomplicated: Secondary | ICD-10-CM

## 2014-06-10 DIAGNOSIS — F419 Anxiety disorder, unspecified: Secondary | ICD-10-CM | POA: Diagnosis not present

## 2014-06-10 DIAGNOSIS — F909 Attention-deficit hyperactivity disorder, unspecified type: Secondary | ICD-10-CM | POA: Diagnosis not present

## 2014-06-10 DIAGNOSIS — R002 Palpitations: Secondary | ICD-10-CM | POA: Diagnosis not present

## 2014-06-10 LAB — COMPREHENSIVE METABOLIC PANEL WITH GFR
ALT: 20 U/L (ref 17–63)
AST: 19 U/L (ref 15–41)
Albumin: 3.6 g/dL (ref 3.5–5.0)
Alkaline Phosphatase: 94 U/L (ref 38–126)
Anion gap: 6 (ref 5–15)
BUN: 12 mg/dL (ref 6–20)
CO2: 28 mmol/L (ref 22–32)
Calcium: 9.1 mg/dL (ref 8.9–10.3)
Chloride: 105 mmol/L (ref 101–111)
Creatinine, Ser: 1.1 mg/dL (ref 0.61–1.24)
GFR calc Af Amer: >60 mL/min
GFR calc non Af Amer: >60 mL/min
Glucose, Bld: 106 mg/dL — ABNORMAL HIGH (ref 65–99)
Potassium: 4 mmol/L (ref 3.5–5.1)
Sodium: 139 mmol/L (ref 135–145)
Total Bilirubin: 0.4 mg/dL (ref 0.3–1.2)
Total Protein: 5.8 g/dL — ABNORMAL LOW (ref 6.5–8.1)

## 2014-06-10 LAB — CBC
HCT: 42.8 % (ref 39.0–52.0)
Hemoglobin: 14.4 g/dL (ref 13.0–17.0)
MCH: 30.1 pg (ref 26.0–34.0)
MCHC: 33.6 g/dL (ref 30.0–36.0)
MCV: 89.4 fL (ref 78.0–100.0)
PLATELETS: 290 10*3/uL (ref 150–400)
RBC: 4.79 MIL/uL (ref 4.22–5.81)
RDW: 12.4 % (ref 11.5–15.5)
WBC: 6.3 10*3/uL (ref 4.0–10.5)

## 2014-06-10 LAB — MRSA PCR SCREENING: MRSA by PCR: NEGATIVE

## 2014-06-10 LAB — SEDIMENTATION RATE: Sed Rate: 1 mm/hr (ref 0–16)

## 2014-06-10 LAB — TROPONIN I
Troponin I: <0.03 ng/mL (ref <0.031)
Troponin I: <0.03 ng/mL (ref <0.031)

## 2014-06-10 LAB — C-REACTIVE PROTEIN: CRP: 1.9 mg/dL — AB (ref <1.0)

## 2014-06-10 LAB — TSH: TSH: 2.214 u[IU]/mL (ref 0.350–4.500)

## 2014-06-10 MED ORDER — IBUPROFEN 800 MG PO TABS
800.0000 mg | ORAL_TABLET | Freq: Three times a day (TID) | ORAL | Status: DC
  Filled 2014-06-10: qty 1

## 2014-06-10 MED ORDER — IBUPROFEN 800 MG PO TABS: 800.0000 mg | ORAL_TABLET | Freq: Three times a day (TID) | ORAL | Status: AC | PRN

## 2014-06-10 MED ORDER — COLCHICINE 0.6 MG PO TABS: 0.6000 mg | ORAL_TABLET | Freq: Two times a day (BID) | ORAL | Status: AC

## 2014-06-10 MED ORDER — IBUPROFEN 200 MG PO TABS
400.0000 mg | ORAL_TABLET | Freq: Four times a day (QID) | ORAL | Status: DC
  Administered 2014-06-10: 400 mg via ORAL

## 2014-06-10 MED ORDER — PERFLUTREN LIPID MICROSPHERE
1.0000 mL | INTRAVENOUS | Status: DC | PRN
  Administered 2014-06-10: 3 mL via INTRAVENOUS

## 2014-06-10 NOTE — Discharge Summary (Signed)
Physician Discharge Summary  Logan King MRN: [id number] DOB/AGE: 08-Jan-1990 24 y.o.  PCP: No PCP Per Patient   Admit date: 06/09/2014 Discharge date: 06/10/2014  Discharge Diagnoses:   Principal Problem:   Chest pain viral pericarditis and/or pleurisy   Marijuana smoker  Follow-up recommendations Follow-up with PCP in 3-5 days PCP requested to follow-up on the results of the 2-D echo     Medication List    TAKE these medications        amphetamine-dextroamphetamine 10 MG tablet  Commonly known as:  ADDERALL  Take 5 mg by mouth daily.     colchicine 0.6 MG tablet  Take 1 tablet (0.6 mg total) by mouth 2 (two) times daily.     ibuprofen 800 MG tablet  Commonly known as:  ADVIL,MOTRIN  Take 1 tablet (800 mg total) by mouth every 8 (eight) hours as needed.         Discharge Condition: Stable    Disposition: 01-Home or Self Care   Consults: Cardiology  Significant Diagnostic Studies:  Dg Chest 2 View  06/08/2014   CLINICAL DATA:  Chest pain and shortness of breath today.  EXAM: CHEST  2 VIEW  COMPARISON:  None.  FINDINGS: Normal heart size and mediastinal contours. No acute infiltrate or edema. No effusion or pneumothorax. No acute osseous findings.  IMPRESSION: Negative chest.   Electronically Signed   By: Monte Fantasia M.D.   On: 06/08/2014 19:41    2-D echo completed results pending   Filed Weights   06/09/14 1411 06/09/14 2217 06/10/14 0623  Weight: 88.451 kg (195 lb) 87.771 kg (193 lb 8 oz) 87.7 kg (193 lb 5.5 oz)     Microbiology: Recent Results (from the past 240 hour(s))  MRSA PCR Screening     Status: None   Collection Time: 06/09/14 10:24 PM  Result Value Ref Range Status   MRSA by PCR NEGATIVE NEGATIVE Final    Comment:        The GeneXpert MRSA Assay (FDA approved for NASAL specimens only), is one component of a comprehensive MRSA colonization surveillance program. It is not intended to diagnose MRSA infection nor to guide  or monitor treatment for MRSA infections.        Blood Culture No results found for: SDES, SPECREQUEST, CULT, REPTSTATUS    Labs: Results for orders placed or performed during the hospital encounter of 06/09/14 (from the past 48 hour(s))  I-Stat Chem 8, ED     Status: Abnormal   Collection Time: 06/09/14  2:43 PM  Result Value Ref Range   Sodium 141 135 - 145 mmol/L   Potassium 3.6 3.5 - 5.1 mmol/L   Chloride 103 101 - 111 mmol/L   BUN 16 6 - 20 mg/dL   Creatinine, Ser 1.10 0.61 - 1.24 mg/dL   Glucose, Bld 102 (H) 65 - 99 mg/dL   Calcium, Ion 1.19 1.12 - 1.23 mmol/L   TCO2 22 0 - 100 mmol/L   Hemoglobin 16.3 13.0 - 17.0 g/dL   HCT 48.0 39.0 - 52.0 %  I-stat troponin, ED     Status: None   Collection Time: 06/09/14  4:39 PM  Result Value Ref Range   Troponin i, poc 0.00 0.00 - 0.08 ng/mL   Comment 3            Comment: Due to the release kinetics of cTnI, a negative result within the first hours of the onset of symptoms does not rule out myocardial infarction with  certainty. If myocardial infarction is still suspected, repeat the test at appropriate intervals.   Urine rapid drug screen (hosp performed)not at [facility]     Status: Abnormal   Collection Time: 06/09/14  8:00 PM  Result Value Ref Range   Opiates NONE DETECTED NONE DETECTED   Cocaine NONE DETECTED NONE DETECTED   Benzodiazepines NONE DETECTED NONE DETECTED   Amphetamines NONE DETECTED NONE DETECTED   Tetrahydrocannabinol POSITIVE (A) NONE DETECTED   Barbiturates NONE DETECTED NONE DETECTED    Comment:        DRUG SCREEN FOR MEDICAL PURPOSES ONLY.  IF CONFIRMATION IS NEEDED FOR ANY PURPOSE, NOTIFY LAB WITHIN 5 DAYS.        LOWEST DETECTABLE LIMITS FOR URINE DRUG SCREEN Drug Class       Cutoff (ng/mL) Amphetamine      1000 Barbiturate      200 Benzodiazepine   779 Tricyclics       390 Opiates          300 Cocaine          300 THC              50   MRSA PCR Screening     Status: None   Collection  Time: 06/09/14 10:24 PM  Result Value Ref Range   MRSA by PCR NEGATIVE NEGATIVE    Comment:        The GeneXpert MRSA Assay (FDA approved for NASAL specimens only), is one component of a comprehensive MRSA colonization surveillance program. It is not intended to diagnose MRSA infection nor to guide or monitor treatment for MRSA infections.   C-reactive protein     Status: Abnormal   Collection Time: 06/09/14 11:10 PM  Result Value Ref Range   CRP 1.9 (H) <1.0 mg/dL  Sedimentation rate     Status: None   Collection Time: 06/09/14 11:10 PM  Result Value Ref Range   Sed Rate 1 0 - 16 mm/hr  Troponin I     Status: None   Collection Time: 06/09/14 11:10 PM  Result Value Ref Range   Troponin I <0.03 <0.031 ng/mL    Comment:        NO INDICATION OF MYOCARDIAL INJURY.   TSH     Status: None   Collection Time: 06/09/14 11:10 PM  Result Value Ref Range   TSH 2.214 0.350 - 4.500 uIU/mL  Troponin I     Status: None   Collection Time: 06/10/14  4:00 AM  Result Value Ref Range   Troponin I <0.03 <0.031 ng/mL    Comment:        NO INDICATION OF MYOCARDIAL INJURY.   CBC     Status: None   Collection Time: 06/10/14  4:00 AM  Result Value Ref Range   WBC 6.3 4.0 - 10.5 K/uL   RBC 4.79 4.22 - 5.81 MIL/uL   Hemoglobin 14.4 13.0 - 17.0 g/dL   HCT 42.8 39.0 - 52.0 %   MCV 89.4 78.0 - 100.0 fL   MCH 30.1 26.0 - 34.0 pg   MCHC 33.6 30.0 - 36.0 g/dL   RDW 12.4 11.5 - 15.5 %   Platelets 290 150 - 400 K/uL  Comprehensive metabolic panel     Status: Abnormal   Collection Time: 06/10/14  4:00 AM  Result Value Ref Range   Sodium 139 135 - 145 mmol/L   Potassium 4.0 3.5 - 5.1 mmol/L   Chloride 105 101 - 111 mmol/L  CO2 28 22 - 32 mmol/L   Glucose, Bld 106 (H) 65 - 99 mg/dL   BUN 12 6 - 20 mg/dL   Creatinine, Ser 1.10 0.61 - 1.24 mg/dL   Calcium 9.1 8.9 - 10.3 mg/dL   Total Protein 5.8 (L) 6.5 - 8.1 g/dL   Albumin 3.6 3.5 - 5.0 g/dL   AST 19 15 - 41 U/L   ALT 20 17 - 63 U/L    Alkaline Phosphatase 94 38 - 126 U/L   Total Bilirubin 0.4 0.3 - 1.2 mg/dL   GFR calc non Af Amer >60 >60 mL/min   GFR calc Af Amer >60 >60 mL/min    Comment: (NOTE) The eGFR has been calculated using the CKD EPI equation. This calculation has not been validated in all clinical situations. eGFR's persistently <60 mL/min signify possible Chronic Kidney Disease.    Anion gap 6 5 - 15     Lipid Panel  No results found for: CHOL, TRIG, HDL, CHOLHDL, VLDL, LDLCALC, LDLDIRECT   No results found for: HGBA1C   Lab Results  Component Value Date   CREATININE 1.10 06/10/2014     HPI :24 year old male with no prior cardiac history who presents to [facility] ER with complaints of chest pain. He does admit to a remote history of polysubstance abuse including cocaine but he denies any recent use. No history of hypertension, diabetes, hyperlipidemia or tobacco abuse. He denies any family history of CAD or sudden cardiac death. He notes past medical history significant for asthma and anxiety.  He reports a 2 day history of left-sided chest pressure radiating to the left upper extremity. Also notes associated dyspnea, diaphoresis, nausea, vomiting and palpitations. No syncope/near-syncope. Pain is pleuritic and also exertional. He denies any recent prolonged travel. No lower extremity pain or swelling. He does admit to trauma to his left upper extremity about 5 days ago when he punched a wall with his left hand. He believes he may have fractured a couple of his digits but this has not been confirmed by x-ray. However he denies any significant swelling or erythema of the left upper extremity. His EKG on arrival demonstrated sinus tachycardia with a heart rate of 135 bpm and right axis deviation. Of note, he was also seen in the emergency department last night and his EKG demonstrated sinus tach and was also concerning for s1q3t3 pattern. D-dimer was negative however it was recommended that he undergo a CT  to rule out PE however the patient left AMA. Due to worsening pain he presented back for repeat evaluation. Nothing has relieved his pain including no relief with GI cocktail. He continues to have discomfort most notably with deep inspiration. Troponin is negative. He denies any recent cocaine use. No recent excessive caffeine, alcohol or other stimulant intake.   HOSPITAL COURSE:  Chest pain, likely secondary to viral pericarditis VS . Pleurisy ECG with IRBBB, rightward axis and S1Q3T3. With normal D-dimer, PE is not likely.  Patient has been started on colchicine and ibuprofen He needs to establish a PCP Patient is to discharge home after completion of 2-D echo  Discharge Exam:  Blood pressure 108/62, pulse 82, temperature 97.9 F (36.6 C), temperature source Oral, resp. rate 14, height 5' 10"  (1.778 m), weight 87.7 kg (193 lb 5.5 oz), SpO2 100 %.  General: Pleasant, NAD Psych: Normal affect. Neuro: Alert and oriented X 3. Moves all extremities spontaneously. HEENT: Normal Neck: Supple without bruits or JVD. Lungs: Resp regular and unlabored, CTA.  Heart: RRR no s3, s4, or murmurs. Abdomen: Soft, non-tender, non-distended, BS + x 4.  Extremities: No clubbing, cyanosis or edema. DP/PT/Radials 2+ and equal bilaterally.       Discharge Instructions    Diet - low sodium heart healthy    Complete by:  As directed      Increase activity slowly    Complete by:  As directed            Follow-up Information    Follow up with Primary care provider. Schedule an appointment as soon as possible for a visit in 1 week.      SignedReyne Dumas 06/10/2014, 10:36 AM        Time spent >45 mins

## 2014-06-10 NOTE — Progress Notes (Signed)
  Echocardiogram 2D Echocardiogram with Definity has been performed.  Rickia Freeburg 06/10/2014, 12:06 PM

## 2014-06-10 NOTE — Progress Notes (Signed)
Pt given discharge information. Pt understands discharge instructions. VSS. escorted home by friend.

## 2014-06-10 NOTE — Care Management (Signed)
1127 06-10-14 CM did provide pt with the [facility] Number for pt to call for PCP. No further needs from CM at this time. Gala Lewandowsky, RN,BSN [phone]

## 2014-06-10 NOTE — Progress Notes (Signed)
Patient Profile: 24 year old male with no prior cardiac history but remote history of polysubstance abuse presenting with pleuritic chest pain. Subjective: Chest pain has transiently improved overnight. He says the anterior pressure has resolved significantly but he still has some tightness with deep inspiration. CP also worse when laying down, and better when sat up.  Objective: Vital signs in last 24 hours: Temp:  [97.8 F (36.6 C)-99.5 F (37.5 C)] 97.9 F (36.6 C) (06/09 0743) Pulse Rate:  [50-145] 53 (06/09 0745) Resp:  [12-27] 17 (06/09 0745) BP: (100-126)/(56-95) 102/66 mmHg (06/09 0745) SpO2:  [92 %-100 %] 100 % (06/09 0745) Weight:  [87.7 kg (193 lb 5.5 oz)-88.451 kg (195 lb)] 87.7 kg (193 lb 5.5 oz) (06/09 0623) Last BM Date: 06/09/14     Intake/Output this shift: Total I/O In: -  Out: 400 [Urine:400]  Medications Current Facility-Administered Medications  Medication Dose Route Frequency Provider Last Rate Last Dose  . acetaminophen (TYLENOL) tablet 650 mg  650 mg Oral Q4H PRN Bonnielee Haff, MD      . amphetamine-dextroamphetamine (ADDERALL) tablet 5 mg  5 mg Oral Q breakfast Bonnielee Haff, MD   5 mg at 06/10/14 0756  . aspirin tablet 325 mg  325 mg Oral Daily Bonnielee Haff, MD   325 mg at 06/10/14 0755  . colchicine tablet 0.6 mg  0.6 mg Oral BID Bonnielee Haff, MD   0.6 mg at 06/10/14 0755  . gi cocktail (Maalox,Lidocaine,Donnatal)  30 mL Oral QID PRN Bonnielee Haff, MD   30 mL at 06/09/14 2238  . heparin injection 5,000 Units  5,000 Units Subcutaneous 3 times per day Bonnielee Haff, MD   5,000 Units at 06/09/14 2215  . morphine 2 MG/ML injection 1 mg  1 mg Intravenous Q3H PRN Bonnielee Haff, MD   1 mg at 06/10/14 0756  . ondansetron (ZOFRAN) injection 4 mg  4 mg Intravenous Q6H PRN Bonnielee Haff, MD        PE: General appearance: alert, cooperative, appears stated age and no distress Neck: no adenopathy, no carotid bruit, no JVD, supple, symmetrical,  trachea midline and thyroid not enlarged, symmetric, no tenderness/mass/nodules Lungs: clear to auscultation bilaterally Heart: irregularly irregular rhythm and S1, S2 normal Abdomen: soft, non-tender; bowel sounds normal; no masses,  no organomegaly Extremities: extremities normal, atraumatic, no cyanosis or edema Pulses: 2+ R radial, 1+ L radial, difficult to assess LE pulse. Skin: Skin color, texture, turgor normal. No rashes or lesions Neurologic: Grossly normal  Lab Results:   Recent Labs  06/08/14 1820 06/09/14 1443 06/10/14 0400  WBC 9.5  --  6.3  HGB 14.3 16.3 14.4  HCT 41.9 48.0 42.8  PLT 289  --  290   BMET  Recent Labs  06/08/14 1820 06/09/14 1443 06/10/14 0400  NA 140 141 139  K 3.8 3.6 4.0  CL 103 103 105  CO2 26  --  28  GLUCOSE 95 102* 106*  BUN _0 CREATININE 1.19 1.10 1.10  CALCIUM 9.4  --  9.1    Telemetry: SB-SR, rate 60, marked sinus arrhythmia.   Studies/Results: 06/08/14 CXR: normal chest   Assessment/Plan Principal Problem:   Chest pain Active Problems:   Marijuana smoker   1. Pleuritic chest pain (also worse when lay down, better when sat up): Troponin negative x3. ECG continues to show RSR' in V1 but no signs of ischemia. Chest pain has improved with colchicine. Of note, his CRP is mildly elevated at 1.9, ESR negative.  Differential for chest pain includes pericarditis, pericardial effusion, pulmonary HTN, mitral valve dz, and pneumonia. CXR clear. Echo pending today.   - possible pericarditis, followup on Echocardiogram. Start Ibuprofen 46m q8hr for 2 weeks. Continue colchicine.   - Some L foot numbness, CP occasionally radiate to the back, however BP not as high as would expect for aortic dissection (pulse weak on L side?).   - for definitive diagnosis, may need cardiac MRI  2. Marijuana abuse: discussed the importance of drug cessation.     3. Asymptomatic bradycardia: HR 40-50s, some HR high 30s.  HAlmyra Deforest PA-C (patient  seen an examined with THervey Ard NP student).  06/10/2014 8:33 AM  Patient examined chart reviewed.  No reason for him to be NPO>  Will call echo lab to expedite Agree with diagnosis of pleuro pericarditis. No rub on exam Rx NSAI and colchicine d/c am if Pain better and echo ok  [employer]

## 2014-06-11 MED FILL — Perflutren Lipid Microsphere IV Susp 1.1 MG/ML: INTRAVENOUS | Qty: 10 | Status: AC

## 2014-10-07 ENCOUNTER — Encounter: Payer: Self-pay | Admitting: Emergency Medicine

## 2014-10-07 ENCOUNTER — Emergency Department
Admission: EM | Admit: 2014-10-07 | Discharge: 2014-10-07 | Disposition: A | Discharge status: Home / Self Care | Payer: BLUE CROSS/BLUE SHIELD | Attending: Emergency Medicine | Admitting: Emergency Medicine

## 2014-10-07 DIAGNOSIS — F1010 Alcohol abuse, uncomplicated: Secondary | ICD-10-CM

## 2014-10-07 DIAGNOSIS — Z79899 Other long term (current) drug therapy: Secondary | ICD-10-CM | POA: Insufficient documentation

## 2014-10-07 DIAGNOSIS — F1910 Other psychoactive substance abuse, uncomplicated: Secondary | ICD-10-CM | POA: Diagnosis not present

## 2014-10-07 DIAGNOSIS — Z0283 Encounter for blood-alcohol and blood-drug test: Secondary | ICD-10-CM | POA: Diagnosis present

## 2014-10-07 HISTORY — DX: Unspecified asthma, uncomplicated: J45.909

## 2014-10-07 LAB — COMPREHENSIVE METABOLIC PANEL
ALT: 28 U/L (ref 17–63)
ANION GAP: 7 (ref 5–15)
AST: 23 U/L (ref 15–41)
Albumin: 4.6 g/dL (ref 3.5–5.0)
Alkaline Phosphatase: 111 U/L (ref 38–126)
BUN: 15 mg/dL (ref 6–20)
CHLORIDE: 104 mmol/L (ref 101–111)
CO2: 30 mmol/L (ref 22–32)
Calcium: 9.5 mg/dL (ref 8.9–10.3)
Creatinine, Ser: 0.98 mg/dL (ref 0.61–1.24)
GFR calc Af Amer: >60 mL/min (ref >60)
GFR calc non Af Amer: >60 mL/min (ref >60)
GLUCOSE: 84 mg/dL (ref 65–99)
POTASSIUM: 4 mmol/L (ref 3.5–5.1)
SODIUM: 141 mmol/L (ref 135–145)
Total Bilirubin: 0.5 mg/dL (ref 0.3–1.2)
Total Protein: 7.1 g/dL (ref 6.5–8.1)

## 2014-10-07 LAB — CBC WITH DIFFERENTIAL/PLATELET
BASOS ABS: 0.1 10*3/uL (ref 0–0.1)
Basophils Relative: 1 %
EOS ABS: 0.7 10*3/uL (ref 0–0.7)
Eosinophils Relative: 10 %
HEMATOCRIT: 42.5 % (ref 40.0–52.0)
Hemoglobin: 14.6 g/dL (ref 13.0–18.0)
Lymphocytes Relative: 29 %
Lymphs Abs: 1.8 10*3/uL (ref 1.0–3.6)
MCH: 29.9 pg (ref 26.0–34.0)
MCHC: 34.4 g/dL (ref 32.0–36.0)
MCV: 86.9 fL (ref 80.0–100.0)
MONO ABS: 0.7 10*3/uL (ref 0.2–1.0)
MONOS PCT: 10 %
NEUTROS ABS: 3.2 10*3/uL (ref 1.4–6.5)
NEUTROS PCT: 50 %
Platelets: 286 10*3/uL (ref 150–440)
RBC: 4.89 MIL/uL (ref 4.40–5.90)
RDW: 12.1 % (ref 11.5–14.5)
WBC: 6.5 10*3/uL (ref 3.8–10.6)

## 2014-10-07 LAB — URINE DRUG SCREEN, QUALITATIVE (ARMC ONLY)
AMPHETAMINES, UR SCREEN: NOT DETECTED
Barbiturates, Ur Screen: NOT DETECTED
Benzodiazepine, Ur Scrn: NOT DETECTED
COCAINE METABOLITE, UR ~~LOC~~: NOT DETECTED
Cannabinoid 50 Ng, Ur ~~LOC~~: NOT DETECTED
MDMA (ECSTASY) UR SCREEN: NOT DETECTED
METHADONE SCREEN, URINE: NOT DETECTED
Opiate, Ur Screen: NOT DETECTED
Phencyclidine (PCP) Ur S: NOT DETECTED
TRICYCLIC, UR SCREEN: NOT DETECTED

## 2014-10-07 LAB — SALICYLATE LEVEL: Salicylate Lvl: <4 mg/dL (ref 2.8–30.0)

## 2014-10-07 LAB — ETHANOL: Alcohol, Ethyl (B): <5 mg/dL (ref <5)

## 2014-10-07 LAB — ACETAMINOPHEN LEVEL

## 2014-10-07 NOTE — BH Assessment (Signed)
Assessment Note  Logan King is an 24 y.o. male who presents to the ER, seeking assistance with detox for his substance use. He reports of drinking alcohol 5 to 6 days out of the week. The amount ranges from 1, 12oz beer to a pint of liquor. He also reports of Cocaine use. It varies from 2 to 3 times a week. He was unable to give an accurate range, due to "getting it from friends."  Patient recently obtained a charge for "Misdemeanor POSSESS MARIJUANA UP TO 1/2 OZ and Misdemeanor POSSESS MARIJ PARAPHERNALIA." According to the [employer], he's court date is set for 12/23/2014.  His reported symptoms of withdrawal are; shakes, cold sweats, some dizziness and anxiety. He denies a history of seizures and blackouts.  This is the patient's first attempt for Substance Abuse Treatment. He was in receiving treatment, in the past, because his parents divorced. Patient denies a history of Suicidal/Homicidal Ideations, Gestures and Attempts.  Diagnosis: Alcohol Use Disorder, Severe                    Cocaine Use Disorder, Severe  Past Medical History:  Past Medical History  Diagnosis Date  . Asthma     Past Surgical History  Procedure Laterality Date  . Hernia repair    . Pilonidal cyst excision    . Fracture surgery      Family History:  Family History  Problem Relation Age of Onset  .       Social History:  reports that he has never smoked. He does not have any smokeless tobacco history on file. He reports that he drinks alcohol. He reports that he uses illicit drugs (Marijuana).  Additional Social History:  Alcohol / Drug Use Pain Medications: History of abuse Prescriptions: History of abuse Over the Counter: None Reported History of alcohol / drug use?: Yes Longest period of sobriety (when/how long):  (2 to 3 days) Negative Consequences of Use: Financial, Legal, Personal relationships, Work / School Withdrawal Symptoms: Nausea / Vomiting, Tremors,  Sweats, Agitation Substance #1 Name of Substance 1: Alcohol 1 - Age of First Use: 15 1 - Amount (size/oz): 1 beer to Pint of liqour 1 - Frequency: 5 to 6 days a week 1 - Duration: Several years 1 - Last Use / Amount: 10/06/2014 Substance #2 Name of Substance 2: Cocaine 2 - Age of First Use: 23 2 - Amount (size/oz): 1 gram 2 - Frequency: 3x to 4x a week 2 - Duration: 3 years 2 - Last Use / Amount: 09/30/2014  CIWA: CIWA-Ar BP: (!) 127/94 mmHg Pulse Rate: 77 Nausea and Vomiting: mild nausea with no vomiting Tactile Disturbances: none Tremor: no tremor Auditory Disturbances: not present Paroxysmal Sweats: no sweat visible Visual Disturbances: not present Anxiety: mildly anxious Headache, Fullness in Head: none present Agitation: normal activity Orientation and Clouding of Sensorium: oriented and can do serial additions CIWA-Ar Total: 2 COWS:    Allergies: No Known Allergies  Home Medications:  (Not in a hospital admission)  OB/GYN Status:  No LMP for male patient.  General Assessment Data Location of Assessment: [facility] ED TTS Assessment: In system Is this a Tele or Face-to-Face Assessment?: Face-to-Face Is this an Initial Assessment or a Re-assessment for this encounter?: Initial Assessment Marital status: Single Maiden name: n/a Is patient pregnant?: No Pregnancy Status: No Living Arrangements: Spouse/significant other Can pt return to current living arrangement?: Yes Admission Status: Voluntary Is patient capable of signing voluntary admission?: Yes  Referral Source: Self/Family/Friend Insurance type: BCBS  Medical Screening Exam [facility] Walk-in ONLY) Medical Exam completed: Yes  Crisis Care Plan Living Arrangements: Spouse/significant other Name of Psychiatrist: None Name of Therapist: None  Education Status Is patient currently in school?: No Current Grade: n/a Highest grade of school patient has completed: [employer] Name of school: n/a Contact  person: n/a  Risk to self with the past 6 months Suicidal Ideation: No Has patient been a risk to self within the past 6 months prior to admission? : No Suicidal Intent: No Has patient had any suicidal intent within the past 6 months prior to admission? : No Is patient at risk for suicide?: No Suicidal Plan?: No Has patient had any suicidal plan within the past 6 months prior to admission? : No Access to Means: No What has been your use of drugs/alcohol within the last 12 months?: Alcohol, Cocaine, THC & Xanax Previous Attempts/Gestures: No How many times?: 0 Other Self Harm Risks: None Reported Triggers for Past Attempts: None known Intentional Self Injurious Behavior: None Family Suicide History: Unknown Recent stressful life event(s): Legal Issues (Active Addiction) Persecutory voices/beliefs?: No Depression: Yes Depression Symptoms: Feeling worthless/self pity, Guilt Substance abuse history and/or treatment for substance abuse?: Yes (Alcohol, Cocaine, THC & Xanax) Suicide prevention information given to non-admitted patients: Not applicable  Risk to Others within the past 6 months Homicidal Ideation: No Does patient have any lifetime risk of violence toward others beyond the six months prior to admission? : No Thoughts of Harm to Others: No Current Homicidal Intent: No Current Homicidal Plan: No Access to Homicidal Means: No Identified Victim: None Reported History of harm to others?: No Assessment of Violence: None Noted Violent Behavior Description: None Reported Does patient have access to weapons?: No Criminal Charges Pending?: Yes Describe Pending Criminal Charges: Misdemeanor POSSESS MARIJUANA UP TO 1/2 OZ & POSSESS MARIJ PARAPHERNALIA  Does patient have a court date: Yes Court Date: 12/23/14 Is patient on probation?: No  Psychosis Hallucinations: None noted Delusions: None noted  Mental Status Report Appearance/Hygiene: Unremarkable, In scrubs, In hospital  gown Eye Contact: Good Motor Activity: Freedom of movement, Unremarkable Speech: Logical/coherent, Unremarkable Level of Consciousness: Alert Mood: Anxious, Guilty, Sad, Pleasant Affect: Appropriate to circumstance, Sad Anxiety Level: Minimal Thought Processes: Coherent, Relevant Judgement: Impaired Orientation: Person, Time, Place, Situation, Appropriate for developmental age Obsessive Compulsive Thoughts/Behaviors: Minimal  Cognitive Functioning Concentration: Normal Memory: Recent Intact, Remote Intact IQ: Average Insight: Fair Impulse Control: Poor Appetite: Fair Weight Loss: 0 Weight Gain: 0 Sleep: No Change Total Hours of Sleep: 6 Vegetative Symptoms: None  ADLScreening [facility] Assessment Services) Patient's cognitive ability adequate to safely complete daily activities?: Yes Patient able to express need for assistance with ADLs?: Yes Independently performs ADLs?: Yes (appropriate for developmental age)  Prior Inpatient Therapy Prior Inpatient Therapy: No Prior Therapy Dates: n/a Prior Therapy Facilty/Provider(s): n/a Reason for Treatment: n/a  Prior Outpatient Therapy Prior Outpatient Therapy: Yes Prior Therapy Dates: 2014 Prior Therapy Facilty/Provider(s): Private Practice Reason for Treatment: Adjustment from parents' divorce Does patient have an ACCT team?: No Does patient have Intensive In-House Services?  : No Does patient have [facility] services? : No Does patient have P4CC services?: No  ADL Screening (condition at time of admission) Patient's cognitive ability adequate to safely complete daily activities?: Yes Patient able to express need for assistance with ADLs?: Yes Independently performs ADLs?: Yes (appropriate for developmental age)       Abuse/Neglect Assessment (Assessment to be complete while patient is  alone) Physical Abuse: Denies Verbal Abuse: Denies Sexual Abuse: Denies Exploitation of patient/patient's resources: Denies Self-Neglect:  Denies Values / Beliefs Cultural Requests During Hospitalization: None Spiritual Requests During Hospitalization: None Consults Spiritual Care Consult Needed: No Social Work Consult Needed: No      Additional Information 1:1 In Past 12 Months?: No CIRT Risk: No Elopement Risk: No Does patient have medical clearance?: Yes  Child/Adolescent Assessment Running Away Risk: Denies (Patient is an adult)  Disposition:  Disposition Initial Assessment Completed for this Encounter: Yes Disposition of Patient: Referred to Patient referred to: [facility]  On Site Evaluation by:   Reviewed with Physician:     Lilyan Gilford, MS, LCAS, LPC, NCC, CCSI 10/07/2014 10:07 AM

## 2014-10-07 NOTE — BHH Counselor (Signed)
Referral information faxed to [facility]([phone]) and confirmed it was received. Patient is able to go to their facility. Patient is aware of the $390.69 co-pay, set by his insurance Herbalist). Patient's mother Zane Herald) will transport him to the facility.

## 2014-10-07 NOTE — ED Provider Notes (Signed)
CSN: [id number]     Arrival date & time 10/07/14  1191 History   First MD Initiated Contact with Patient 10/07/14 [phone]     Chief Complaint  Patient presents with  . Drug / Alcohol Assessment     (Consider location/radiation/quality/duration/timing/severity/associated sxs/prior Treatment) The history is provided by the patient.  Logan King is a 24 y.o. male hx of asthma, drug and alcohol abuse here requesting detox. He went to [facility] earlier and sent here of clearance.. He has been drinking alcohol daily. Drinks about a beer a day on average but sometimes binge drinks. Also uses cocaine and marijuana occasionally. Has been buying prescriptions drugs- percocet and xanax as well. Denies shakiness or chest pain. Denies suicidal or homicidal ideations.    Past Medical History  Diagnosis Date  . Asthma    Past Surgical History  Procedure Laterality Date  . Hernia repair    . Pilonidal cyst excision    . Fracture surgery     Family History  Problem Relation Age of Onset  .      Social History  Substance Use Topics  . Smoking status: Never Smoker   . Smokeless tobacco: None  . Alcohol Use: 0.0 oz/week    0 Standard drinks or equivalent per week    Review of Systems  All other systems reviewed and are negative.     Allergies  Review of patient's allergies indicates no known allergies.  Home Medications   Prior to Admission medications   Medication Sig Start Date End Date Taking? Authorizing Provider  amphetamine-dextroamphetamine (ADDERALL) 10 MG tablet Take 5 mg by mouth daily.  03/30/14   Historical Provider, MD  colchicine 0.6 MG tablet Take 1 tablet (0.6 mg total) by mouth 2 (two) times daily. 06/10/14   Richarda Overlie, MD  ibuprofen (ADVIL,MOTRIN) 800 MG tablet Take 1 tablet (800 mg total) by mouth every 8 (eight) hours as needed. 06/10/14   Richarda Overlie, MD   BP 100/63 mmHg  Pulse 78  Temp(Src) 98 F (36.7 C) (Oral)  Resp 18  Ht  (1.778 m)  Wt 202 lb  (91.627 kg)  BMI 28.98 kg/m2  SpO2 98% Physical Exam  Constitutional: He is oriented to person, place, and time. He appears well-developed and well-nourished.  HENT:  Head: Normocephalic.  Mouth/Throat: Oropharynx is clear and moist.  Eyes: Conjunctivae are normal. Pupils are equal, round, and reactive to light.  Neck: Normal range of motion. Neck supple.  Cardiovascular: Normal rate, regular rhythm and normal heart sounds.   Pulmonary/Chest: Effort normal and breath sounds normal. No respiratory distress. He has no wheezes. He has no rales.  Abdominal: Soft. Bowel sounds are normal. He exhibits no distension. There is no tenderness. There is no rebound.  Musculoskeletal: Normal range of motion. He exhibits no edema or tenderness.  Neurological: He is alert and oriented to person, place, and time. No cranial nerve deficit. Coordination normal.  Skin: Skin is warm and dry.  Psychiatric: He has a normal mood and affect. His behavior is normal. Judgment and thought content normal.  Nursing note and vitals reviewed.   ED Course  Procedures (including critical care time) Labs Review Labs Reviewed  ACETAMINOPHEN LEVEL - Abnormal; Notable for the following:    Acetaminophen (Tylenol), Serum <10 (*)    All other components within normal limits  CBC WITH DIFFERENTIAL/PLATELET  COMPREHENSIVE METABOLIC PANEL  ETHANOL  SALICYLATE LEVEL  URINE DRUG SCREEN, QUALITATIVE ([facility] ONLY)    Imaging Review No  results found. I have personally reviewed and evaluated these images and lab results as part of my medical decision-making.   EKG Interpretation None      MDM   Final diagnoses:  None    Logan King is a 24 y.o. male here with drug detox. Vitals stable, well appearing. Will check labs, UDS. Will consult TTS.   11:08 AM UDS neg. Labs unremarkable. Accepted at [facility]. Stable for discharge.    Richardean Canal, MD 10/07/14 [phone]

## 2014-10-07 NOTE — Discharge Instructions (Signed)
Go to [facility] for drug rehab.   Return to ER if you have thoughts of harming yourself or others, overdose.

## 2014-10-07 NOTE — ED Notes (Signed)
Pt states he has hx of ETOH, marijuana, and cocaine abuse "for many years". Reports weekly use as 2-3 beers daily, occasional marijuana, and approx 2gm cocaine use. Presents to ED today for medical clearance for detox program. Pt is CAOx4 and in NAD.

## 2014-10-07 NOTE — ED Notes (Signed)
Patient presents with mother with request for detox. Patient states alcohol detox and last intake was yesterday and was a 12 ounce Budweiser. Patient also states "probably just a general detox but nothing that's shot up or anything." Patient and mother are very pleasant and cooperative at triage.

## 2014-10-07 NOTE — ED Notes (Signed)
Patient requesting detox from alcohol, cocaine, marijuana, xanax, and percocet.  No history of prior detoxification.

## 2014-10-07 NOTE — ED Notes (Signed)
TTS at bedside. 

## 2015-07-12 ENCOUNTER — Ambulatory Visit: Payer: BLUE CROSS/BLUE SHIELD | Admitting: Family Medicine

## 2015-07-28 ENCOUNTER — Ambulatory Visit: Payer: BLUE CROSS/BLUE SHIELD | Admitting: Family Medicine

## 2015-07-28 DIAGNOSIS — Z0289 Encounter for other administrative examinations: Secondary | ICD-10-CM

## 2016-02-15 IMAGING — DX DG CHEST 2V
2 series · 2 of 2 positions shown · non-contrast
Comparison: None.

CLINICAL DATA: Chest pain and shortness of breath today.

EXAM:
CHEST  2 VIEW

[chest pa]
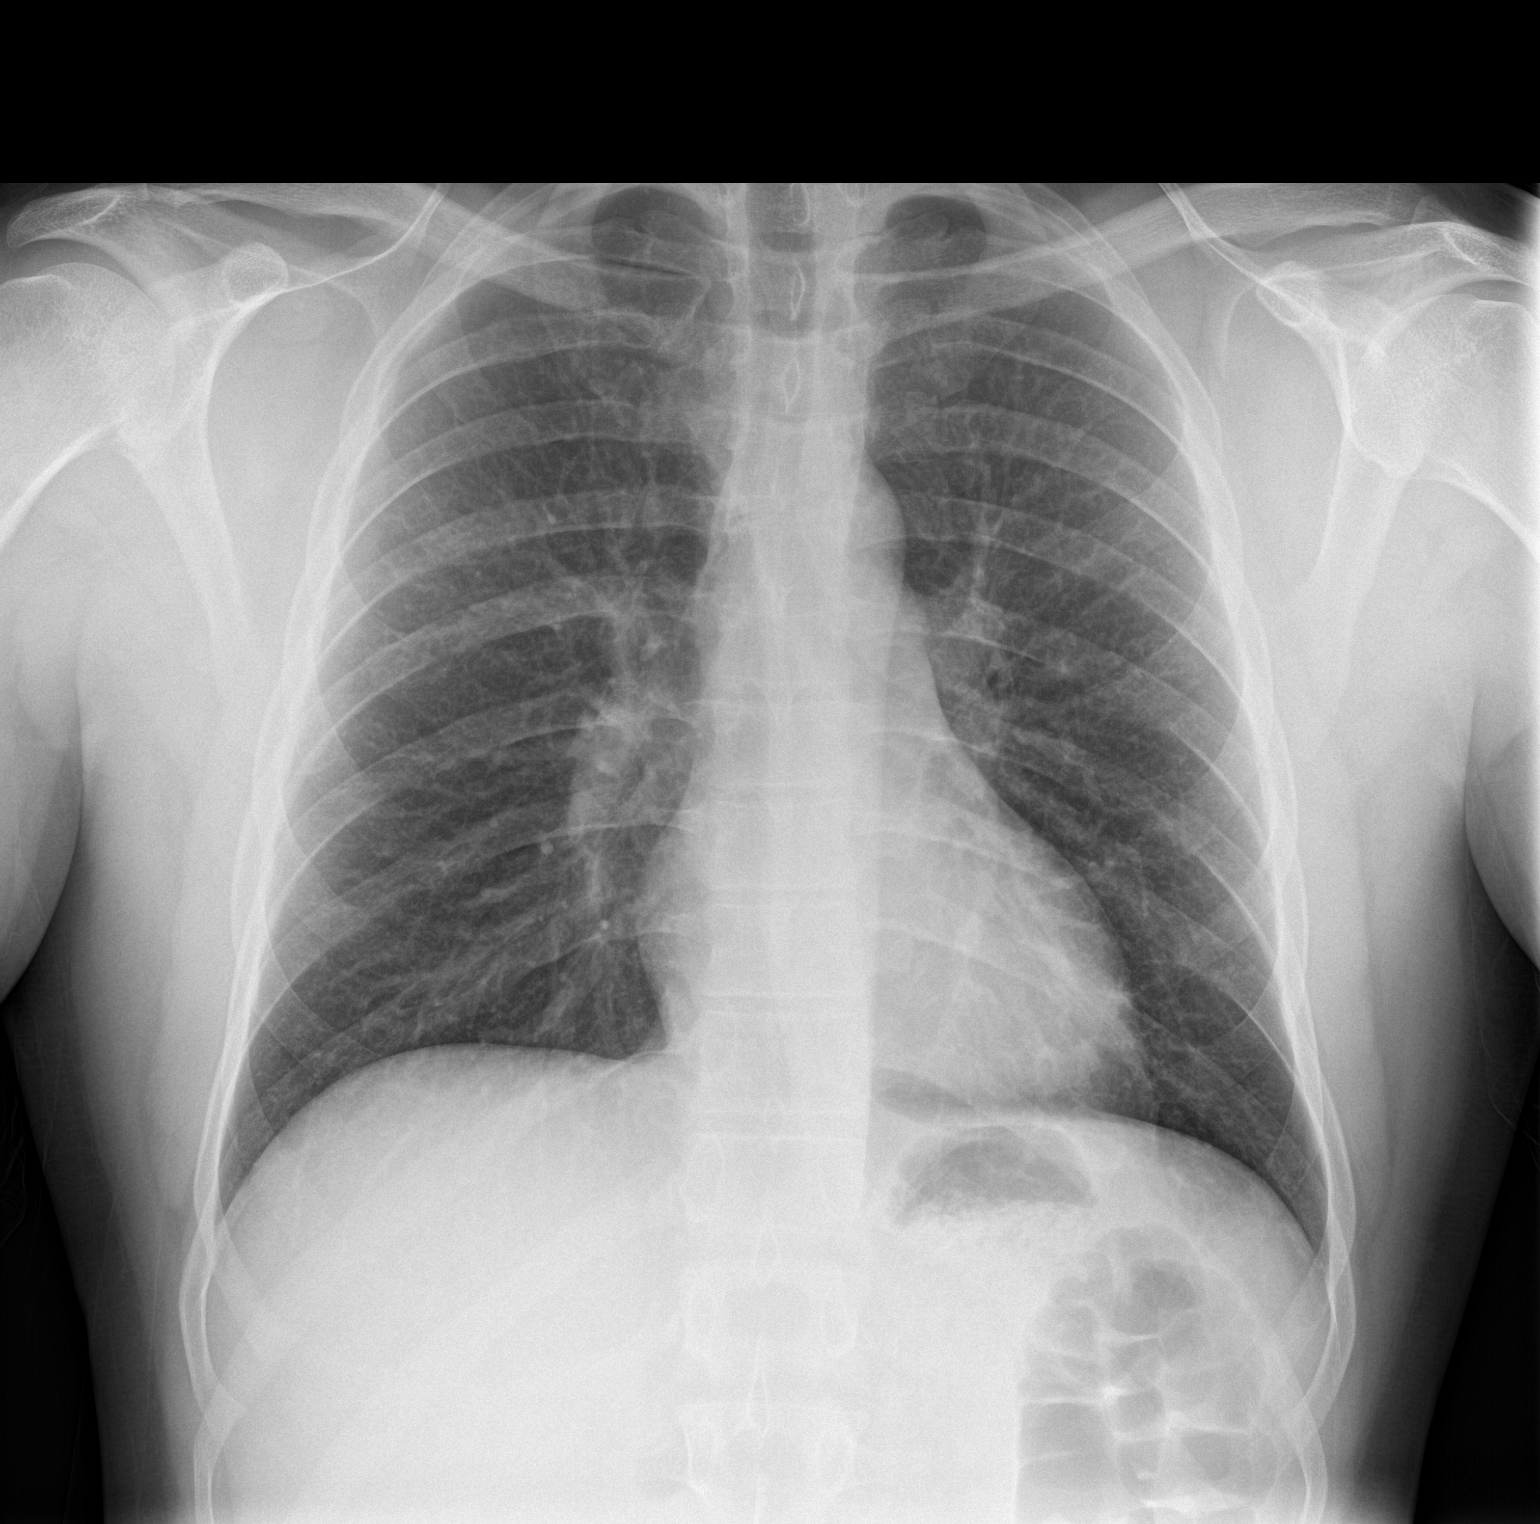

[chest lat]
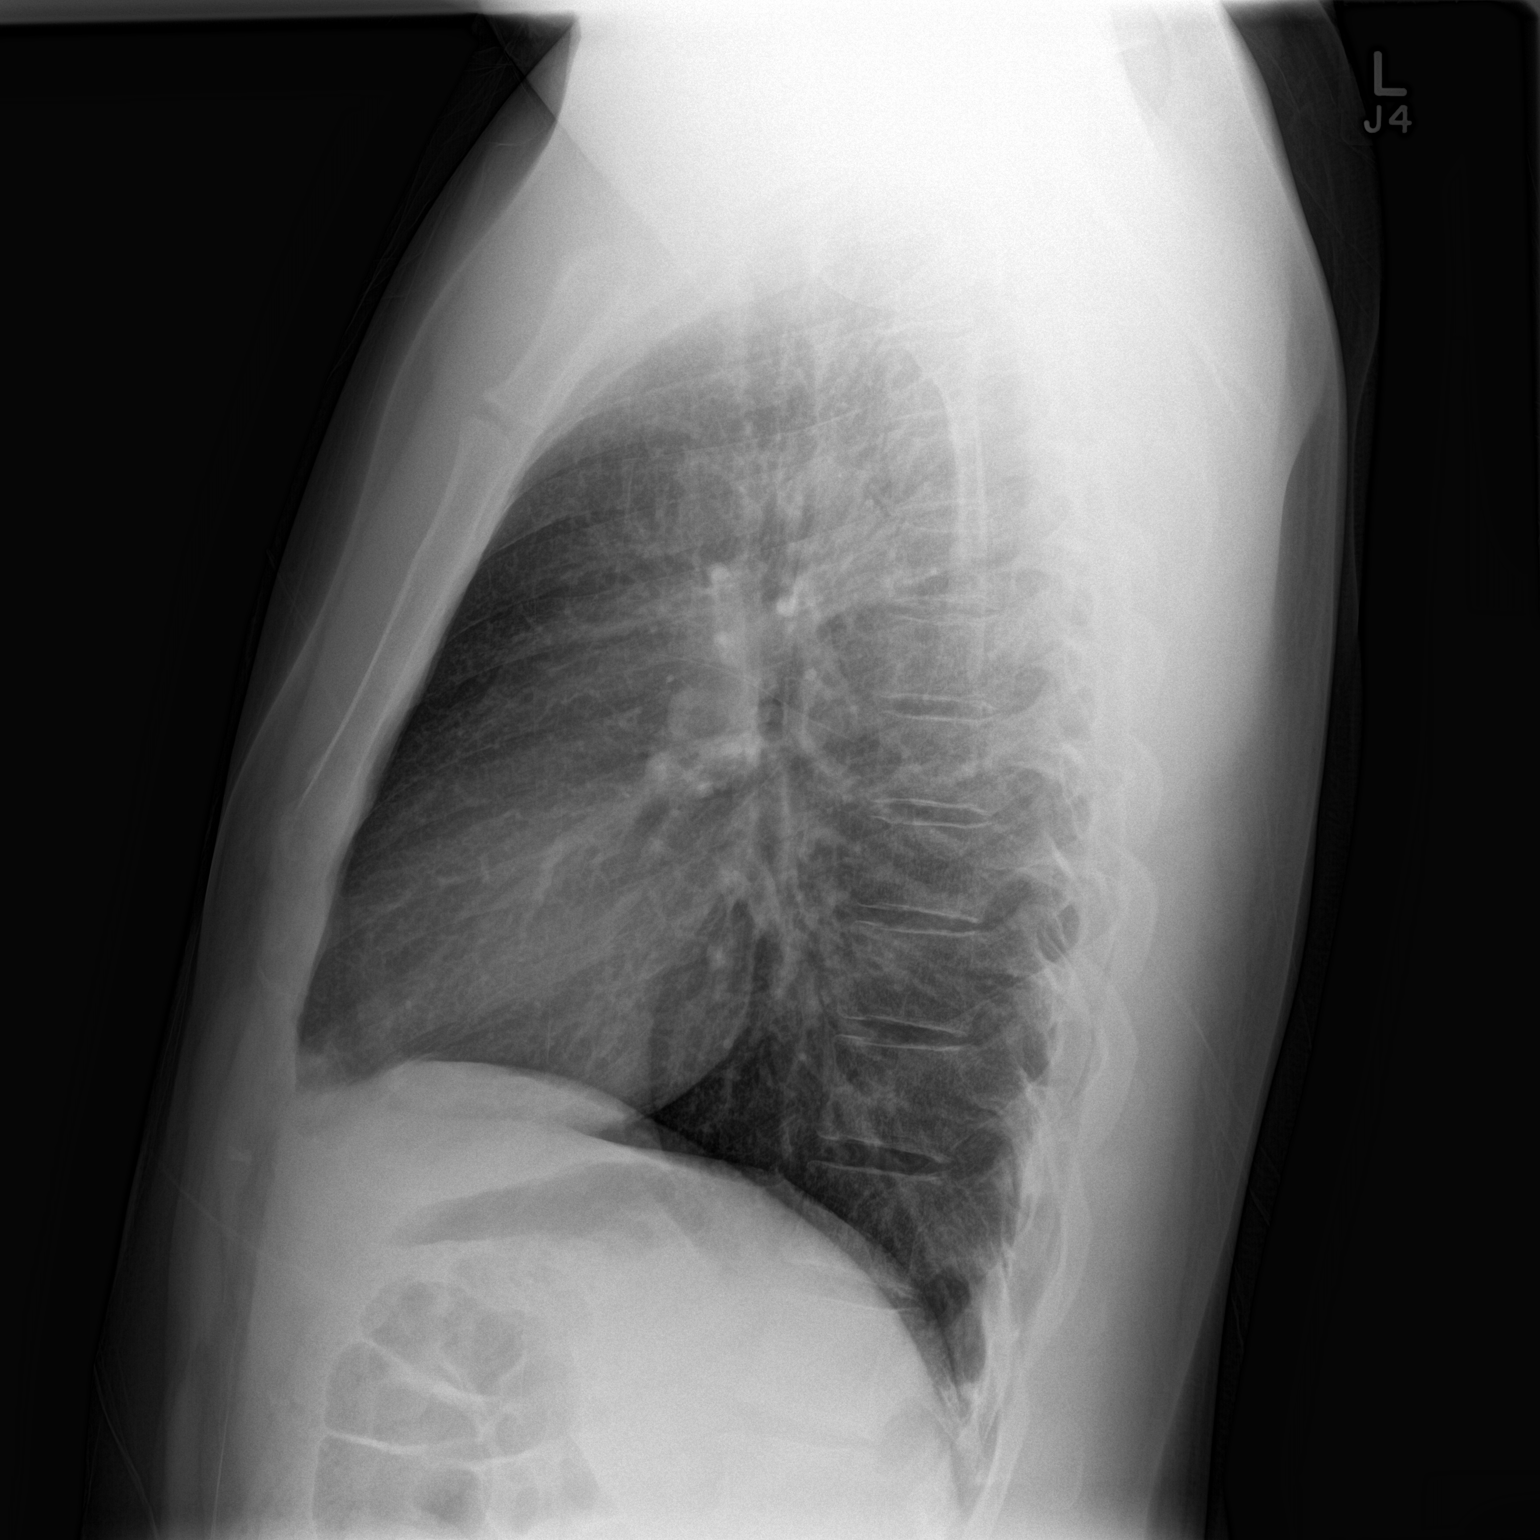

[2 of 2 positions shown; findings below may reference images not displayed]

FINDINGS: Normal heart size and mediastinal contours. No acute infiltrate or
edema. No effusion or pneumothorax. No acute osseous findings.
IMPRESSION: Negative chest.

## 2018-05-08 ENCOUNTER — Emergency Department
Admission: EM | Admit: 2018-05-08 | Discharge: 2018-05-08 | Disposition: A | Discharge status: Home / Self Care | Payer: BLUE CROSS/BLUE SHIELD | Attending: Emergency Medicine | Admitting: Emergency Medicine

## 2018-05-08 ENCOUNTER — Encounter: Payer: Self-pay | Admitting: Emergency Medicine

## 2018-05-08 ENCOUNTER — Other Ambulatory Visit: Payer: Self-pay

## 2018-05-08 DIAGNOSIS — F1910 Other psychoactive substance abuse, uncomplicated: Secondary | ICD-10-CM

## 2018-05-08 DIAGNOSIS — Z79899 Other long term (current) drug therapy: Secondary | ICD-10-CM | POA: Insufficient documentation

## 2018-05-08 DIAGNOSIS — J45909 Unspecified asthma, uncomplicated: Secondary | ICD-10-CM | POA: Insufficient documentation

## 2018-05-08 LAB — CBC WITH DIFFERENTIAL/PLATELET
Abs Immature Granulocytes: 0.04 10*3/uL (ref 0.00–0.07)
Basophils Absolute: 0.1 10*3/uL (ref 0.0–0.1)
Basophils Relative: 1 %
Eosinophils Absolute: 0.4 10*3/uL (ref 0.0–0.5)
Eosinophils Relative: 4 %
HCT: 43.7 % (ref 39.0–52.0)
Hemoglobin: 15.4 g/dL (ref 13.0–17.0)
Immature Granulocytes: 0 %
Lymphocytes Relative: 22 %
Lymphs Abs: 2 10*3/uL (ref 0.7–4.0)
MCH: 30.9 pg (ref 26.0–34.0)
MCHC: 35.2 g/dL (ref 30.0–36.0)
MCV: 87.8 fL (ref 80.0–100.0)
Monocytes Absolute: 0.8 10*3/uL (ref 0.1–1.0)
Monocytes Relative: 9 %
Neutro Abs: 5.8 10*3/uL (ref 1.7–7.7)
Neutrophils Relative %: 64 %
Platelets: 322 10*3/uL (ref 150–400)
RBC: 4.98 MIL/uL (ref 4.22–5.81)
RDW: 12.2 % (ref 11.5–15.5)
WBC: 9.2 10*3/uL (ref 4.0–10.5)
nRBC: 0 % (ref 0.0–0.2)

## 2018-05-08 LAB — COMPREHENSIVE METABOLIC PANEL
ALT: 26 U/L (ref 0–44)
AST: 25 U/L (ref 15–41)
Albumin: 4.6 g/dL (ref 3.5–5.0)
Alkaline Phosphatase: 106 U/L (ref 38–126)
Anion gap: 11 (ref 5–15)
BUN: 17 mg/dL (ref 6–20)
CO2: 25 mmol/L (ref 22–32)
Calcium: 9.4 mg/dL (ref 8.9–10.3)
Chloride: 103 mmol/L (ref 98–111)
Creatinine, Ser: 1.05 mg/dL (ref 0.61–1.24)
GFR calc Af Amer: >60 mL/min (ref >60)
GFR calc non Af Amer: >60 mL/min (ref >60)
Glucose, Bld: 93 mg/dL (ref 70–99)
Potassium: 3.7 mmol/L (ref 3.5–5.1)
Sodium: 139 mmol/L (ref 135–145)
Total Bilirubin: 0.5 mg/dL (ref 0.3–1.2)
Total Protein: 7.7 g/dL (ref 6.5–8.1)

## 2018-05-08 LAB — URINE DRUG SCREEN, QUALITATIVE (ARMC ONLY)
Amphetamines, Ur Screen: NOT DETECTED
Barbiturates, Ur Screen: NOT DETECTED
Benzodiazepine, Ur Scrn: POSITIVE — AB
Cannabinoid 50 Ng, Ur ~~LOC~~: POSITIVE — AB
Cocaine Metabolite,Ur ~~LOC~~: POSITIVE — AB
MDMA (Ecstasy)Ur Screen: NOT DETECTED
Methadone Scn, Ur: NOT DETECTED
Opiate, Ur Screen: NOT DETECTED
Phencyclidine (PCP) Ur S: NOT DETECTED
Tricyclic, Ur Screen: NOT DETECTED

## 2018-05-08 LAB — ETHANOL: Alcohol, Ethyl (B): <10 mg/dL (ref <10)

## 2018-05-08 LAB — SALICYLATE LEVEL: Salicylate Lvl: <7 mg/dL (ref 2.8–30.0)

## 2018-05-08 LAB — ACETAMINOPHEN LEVEL: Acetaminophen (Tylenol), Serum: <10 ug/mL — ABNORMAL LOW (ref 10–30)

## 2018-05-08 NOTE — ED Provider Notes (Signed)
[facility] Emergency Department Provider Note  ____________________________________________  Time seen: Approximately 2:50 PM  I have reviewed the triage vital signs and the nursing notes.   HISTORY  Chief Complaint Addiction Problem    HPI Logan King is a 28 y.o. male with a history of asthma who requests evaluation for detox due to polysubstance abuse.   Reports that he drinks beer and liquor daily, last drink was last night.  Also used cocaine last night and marijuana.  No IV drug use.  Reports that after 2 days of not drinking he might get a little bit tremulous but has never had hallucinosis or DTs or seizures.  Currently feels totally fine and asymptomatic.  Denies any headache chest pain shortness of breath belly pain or back pain after using cocaine.     Past Medical History:  Diagnosis Date  . Asthma      Patient Active Problem List   Diagnosis Date Noted  . Marijuana smoker 06/10/2014  . Chest pain 06/09/2014     Past Surgical History:  Procedure Laterality Date  . FRACTURE SURGERY    . HERNIA REPAIR    . PILONIDAL CYST EXCISION       Prior to Admission medications   Medication Sig Start Date End Date Taking? Authorizing Provider  amphetamine-dextroamphetamine (ADDERALL) 10 MG tablet Take 5 mg by mouth daily.  03/30/14   [provider]  colchicine 0.6 MG tablet Take 1 tablet (0.6 mg total) by mouth 2 (two) times daily. 06/10/14   Richarda Overlie, MD  ibuprofen (ADVIL,MOTRIN) 800 MG tablet Take 1 tablet (800 mg total) by mouth every 8 (eight) hours as needed. 06/10/14   Richarda Overlie, MD     Allergies Patient has no known allergies.   No family history on file.  Social History Social History   Tobacco Use  . Smoking status: Never Smoker  Substance Use Topics  . Alcohol use: Yes    Alcohol/week: 0.0 standard drinks  . Drug use: Yes    Types: Marijuana    Review of Systems  Constitutional:   No fever or  chills.  ENT:   No sore throat. No rhinorrhea. Cardiovascular:   No chest pain or syncope. Respiratory:   No dyspnea or cough. Gastrointestinal:   Negative for abdominal pain, vomiting and diarrhea.  Musculoskeletal:   Negative for focal pain or swelling All other systems reviewed and are negative except as documented above in ROS and HPI.  ____________________________________________   PHYSICAL EXAM:  VITAL SIGNS: ED Triage Vitals  Enc Vitals Group     BP 05/08/18 1317 123/81     Pulse Rate 05/08/18 1317 97     Resp 05/08/18 1317 20     Temp 05/08/18 1317 98.3 F (36.8 C)     Temp Source 05/08/18 1317 Oral     SpO2 05/08/18 1317 97 %     Weight 05/08/18 1300 235 lb (106.6 kg)     Height 05/08/18 1300 5\' 10"  (1.778 m)     Head Circumference --      Peak Flow --      Pain Score 05/08/18 1300 0     Pain Loc --      Pain Edu? --      Excl. in GC? --     Vital signs reviewed, nursing assessments reviewed.   Constitutional:   Alert and oriented. Non-toxic appearance. Eyes:   Conjunctivae are normal. EOMI. PERRL. ENT  Head:   Normocephalic and atraumatic.      Nose:   No congestion/rhinnorhea.       Mouth/Throat:   MMM, no pharyngeal erythema. No peritonsillar mass.       Neck:   No meningismus. Full ROM. Hematological/Lymphatic/Immunilogical:   No cervical lymphadenopathy. Cardiovascular:   RRR. Symmetric bilateral radial and DP pulses.  No murmurs. Cap refill less than 2 seconds. Respiratory:   Normal respiratory effort without tachypnea/retractions. Breath sounds are clear and equal bilaterally. No wheezes/rales/rhonchi. Gastrointestinal:   Soft and nontender. Non distended. There is no CVA tenderness.  No rebound, rigidity, or guarding.  Musculoskeletal:   Normal range of motion in all extremities. No joint effusions.  No lower extremity tenderness.  No edema. Neurologic:   Normal speech and language.  Motor grossly intact. No acute focal neurologic deficits are  appreciated.  Skin:    Skin is warm, dry and intact.  No wounds or track marks.  No rash noted.  No petechiae, purpura, or bullae.  ____________________________________________    LABS (pertinent positives/negatives) (all labs ordered are listed, but only abnormal results are displayed) Labs Reviewed  COMPREHENSIVE METABOLIC PANEL  CBC WITH DIFFERENTIAL/PLATELET  ACETAMINOPHEN LEVEL  ETHANOL  SALICYLATE LEVEL  URINE DRUG SCREEN, QUALITATIVE ([facility] ONLY)   ____________________________________________   EKG    ____________________________________________    RADIOLOGY  No results found.  ____________________________________________   PROCEDURES Procedures  ____________________________________________    CLINICAL IMPRESSION / ASSESSMENT AND PLAN / ED COURSE  Medications ordered in the ED: Medications - No data to display  Pertinent labs & imaging results that were available during my care of the patient were reviewed by me and considered in my medical decision making (see chart for details).  Logan King was evaluated in Emergency Department on 05/08/2018 for the symptoms described in the history of present illness. He was evaluated in the context of the global COVID-19 pandemic, which necessitated consideration that the patient might be at risk for infection with the SARS-CoV-2 virus that causes COVID-19. Institutional protocols and algorithms that pertain to the evaluation of patients at risk for COVID-19 are in a state of rapid change based on information released by regulatory bodies including the CDC and federal and state organizations. These policies and algorithms were followed during the patient's care in the ED.   Patient presents requesting help with substance abuse issues.  Vital signs are normal.  Exam is normal.  He was evaluated by our TTS specialist who has confirmed with him that he already has a detox program in place that he will start in 4 days.   In the meantime he can follow-up with [facility].  Low risk for severe withdrawal syndrome, stable for discharge home without any medications at this time.      ____________________________________________   FINAL CLINICAL IMPRESSION(S) / ED DIAGNOSES    Final diagnoses:  Polysubstance abuse [facility](HCC)     ED Discharge Orders    None      Portions of this note were generated with dragon dictation software. Dictation errors may occur despite best attempts at proofreading.   Sharman CheekStafford, Charman Blasco, MD 05/08/18 [phone]

## 2018-05-08 NOTE — ED Triage Notes (Signed)
Pt states has been drinking for about 12 years, daily

## 2018-05-08 NOTE — ED Notes (Signed)
Pt discharged to home.  Denies SI, HI, and A/V hallucinations.  Verbalizes understanding of discharge instructions and follow up care.

## 2018-05-08 NOTE — ED Triage Notes (Signed)
Pt reports needs detox from ETOH. Pt states last drank last and admits to drinking beer and liquor.

## 2018-05-27 ENCOUNTER — Other Ambulatory Visit: Payer: Self-pay | Admitting: Family Medicine

## 2018-05-27 MED ORDER — VALACYCLOVIR HCL 1 G PO TABS: 1000.0000 mg | ORAL_TABLET | Freq: Two times a day (BID) | ORAL | 2 refills | Status: AC

## 2022-05-24 ENCOUNTER — Emergency Department: Payer: Self-pay

## 2022-05-24 ENCOUNTER — Emergency Department
Admission: EM | Admit: 2022-05-24 | Discharge: 2022-05-24 | Disposition: A | Discharge status: Home / Self Care | Payer: Self-pay | Attending: Emergency Medicine | Admitting: Emergency Medicine

## 2022-05-24 DIAGNOSIS — M5126 Other intervertebral disc displacement, lumbar region: Secondary | ICD-10-CM | POA: Diagnosis not present

## 2022-05-24 DIAGNOSIS — X500XXA Overexertion from strenuous movement or load, initial encounter: Secondary | ICD-10-CM | POA: Diagnosis not present

## 2022-05-24 DIAGNOSIS — S39012A Strain of muscle, fascia and tendon of lower back, initial encounter: Secondary | ICD-10-CM | POA: Insufficient documentation

## 2022-05-24 DIAGNOSIS — M5136 Other intervertebral disc degeneration, lumbar region: Secondary | ICD-10-CM

## 2022-05-24 DIAGNOSIS — Y990 Civilian activity done for income or pay: Secondary | ICD-10-CM | POA: Insufficient documentation

## 2022-05-24 DIAGNOSIS — S3992XA Unspecified injury of lower back, initial encounter: Secondary | ICD-10-CM | POA: Diagnosis present

## 2022-05-24 MED ORDER — PREDNISONE 20 MG PO TABS
60.0000 mg | ORAL_TABLET | Freq: Once | ORAL | Status: AC
  Administered 2022-05-24: 60 mg via ORAL
  Filled 2022-05-24: qty 3

## 2022-05-24 MED ORDER — METHOCARBAMOL 500 MG PO TABS
1000.0000 mg | ORAL_TABLET | Freq: Once | ORAL | Status: AC
  Administered 2022-05-24: 1000 mg via ORAL
  Filled 2022-05-24: qty 2

## 2022-05-24 MED ORDER — HYDROCODONE-ACETAMINOPHEN 5-325 MG PO TABS
1.0000 | ORAL_TABLET | Freq: Once | ORAL | Status: AC
  Administered 2022-05-24: 1 via ORAL
  Filled 2022-05-24: qty 1

## 2022-05-24 MED ORDER — METHOCARBAMOL 500 MG PO TABS: 500.0000 mg | ORAL_TABLET | Freq: Four times a day (QID) | ORAL | 0 refills | Status: AC

## 2022-05-24 MED ORDER — HYDROCODONE-ACETAMINOPHEN 5-325 MG PO TABS: 1.0000 | ORAL_TABLET | ORAL | 0 refills | Status: AC | PRN

## 2022-05-24 MED ORDER — MELOXICAM 7.5 MG PO TABS
15.0000 mg | ORAL_TABLET | Freq: Once | ORAL | Status: AC
  Administered 2022-05-24: 15 mg via ORAL
  Filled 2022-05-24: qty 2

## 2022-05-24 MED ORDER — METHYLPREDNISOLONE 4 MG PO TBPK: ORAL_TABLET | ORAL | 0 refills | Status: AC

## 2022-05-24 MED ORDER — MELOXICAM 15 MG PO TABS: 15.0000 mg | ORAL_TABLET | Freq: Every day | ORAL | 0 refills | Status: DC

## 2022-05-24 NOTE — ED Triage Notes (Signed)
Pt presents to the ED due to back pain. 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 Pt states denies urinary symptoms. Pt A&Ox4

## 2022-05-24 NOTE — ED Provider Notes (Signed)
[facility] Provider Note  Patient Contact: 7:19 PM (approximate)   History   Back Pain   HPI  Logan King is a 32 y.o. male Who presents the emergency patient complaining of lower back pain. was at work, was lifting a heavy tire when he felt a pulling sensation in his back.  Patient then had another injury at work yesterday with a sharp pain that developed in his lower back.  He since had spasmodic sharp pain in his lower back.  No history of previous back injuries.  No bowel or bladder dysfunction, saddle anesthesia or paresthesias.  Patient has been trying some over-the-counter medication without relief.     Physical Exam   Triage Vital Signs: ED Triage Vitals  Enc Vitals Group     BP 05/24/22 1719 137/76     Pulse Rate 05/24/22 1719 99     Resp 05/24/22 1719 18     Temp 05/24/22 1719 98.6 F (37 C)     Temp Source 05/24/22 1719 Oral     SpO2 05/24/22 1719 96 %     Weight 05/24/22 1724 233 lb 11 oz (106 kg)     Height --      Head Circumference --      Peak Flow --      Pain Score 05/24/22 1724 8     Pain Loc --      Pain Edu? --      Excl. in GC? --     Most recent vital signs: Vitals:   05/24/22 1719  BP: 137/76  Pulse: 99  Resp: 18  Temp: 98.6 F (37 C)  SpO2: 96%     General: Alert and in no acute distress.  Cardiovascular:  Good peripheral perfusion Respiratory: Normal respiratory effort without tachypnea or retractions. Lungs CTAB.  Musculoskeletal: Full range of motion to all extremities.  Visualization of the lower back reveals no visible signs of trauma.  Palpation reveals midline tenderness over the L3-L4 region as well as associated lumbar paraspinal muscle spasms on the left side.  Slight spasm on the right but not as much on the left.  No extension into the SI joint or sciatic notch.  Dorsalis pedis pulses sensation intact and equal bilateral lower extremities. Neurologic:  No gross focal neurologic deficits are  appreciated.  Skin:   No rash noted Other:   ED Results / Procedures / Treatments   Labs (all labs ordered are listed, but only abnormal results are displayed) Labs Reviewed - No data to display   EKG     RADIOLOGY  I personally viewed, evaluated, and interpreted these images as part of my medical decision making, as well as reviewing the written report by the radiologist.  ED Provider Interpretation: Multilevel degenerative changes most severe at L4-L5.  No central cord compression.  CT Lumbar Spine Wo Contrast  Result Date: 05/24/2022 CLINICAL DATA:  Lumbar radiculopathy trauma, heavy lifting. Sharp sudden back pain shooting down the leg. EXAM: CT LUMBAR SPINE WITHOUT CONTRAST TECHNIQUE: Multidetector CT imaging of the lumbar spine was performed without intravenous contrast administration. Multiplanar CT image reconstructions were also generated. RADIATION DOSE REDUCTION: This exam was performed according to the departmental dose-optimization program which includes automated exposure control, adjustment of the mA and/or kV according to patient size and/or use of iterative reconstruction technique. COMPARISON:  None Available. FINDINGS: Segmentation: 5 lumbar type vertebrae. Alignment: Normal. Vertebrae: No acute fracture or aggressive osseous lesion. Paraspinal and other soft tissues:  Negative. Other: None Disc levels: T12-L1: Mild disc height loss without significant disc bulge, spinal canal or neural foraminal stenosis. L1-L2: Mild disc height loss without significant disc bulge spinal canal or neural foraminal stenosis. L2-L3: No significant disc bulge, spinal canal or neural foraminal stenosis. L3-L4: Mild circumferential disc bulge with lateral recess stenosis bilaterally. Mild spinal canal stenosis. No significant neural foraminal stenosis. L4-L5: Broad-based circumferential disc bulge and lateral recess stenosis. Ligamentum flavum hypertrophy with mild spinal canal stenosis. No  significant neural foraminal stenosis. Mild bilateral facet joint arthropathy. L5-S1: Mild disc bulge and lateral recess stenosis. Mild bilateral facet joint arthropathy. No significant neural foraminal stenosis. IMPRESSION: 1. No acute fracture or traumatic subluxation. 2. Mild multilevel degenerative disc disease at L3-L4, L4-L5 and L5-S1. Mild spinal canal stenosis at L3-L4 and L4-L5. Lateral recess stenosis at L3-L4, L4-L5 and L5-S1. No significant neural foraminal stenosis. Electronically Signed   By: Larose Hires D.O.   On: 05/24/2022 19:02    PROCEDURES:  Critical Care performed: No  Procedures   MEDICATIONS ORDERED IN ED: Medications  predniSONE (DELTASONE) tablet 60 mg (has no administration in time range)  meloxicam (MOBIC) tablet 15 mg (has no administration in time range)  methocarbamol (ROBAXIN) tablet 1,000 mg (has no administration in time range)  HYDROcodone-acetaminophen (NORCO/VICODIN) 5-325 MG per tablet 1 tablet (has no administration in time range)     IMPRESSION / MDM / ASSESSMENT AND PLAN / ED COURSE  I reviewed the triage vital signs and the nursing notes.                                 Differential diagnosis includes, but is not limited to, lumbar strain, herniated disc, bulging disc, degenerative disc disease, compression fracture  Patient's presentation is most consistent with acute presentation with potential threat to life or bodily function.   Patient's diagnosis is consistent with lumbar strain, bulging disc.  Patient presents emergency department with sharp lower back pain.  He had 2 injuries over the last several days resulting in worsening pain.  No concerning neurodeficits.  Patient had tenderness to the lumbar spine and associated paraspinal muscle spasm.  Patient had CT scan which revealed some bulging disc without central cord compression.  Patient was neurologically intact and given these results I do not feel that MRI is currently warranted.  To  be treated symptomatically.  Recommend.  Follow-up with primary care or orthopedics/neurosurgery as needed.  Return precautions discussed with the patient.  Patient is given ED precautions to return to the ED for any worsening or new symptoms.     FINAL CLINICAL IMPRESSION(S) / ED DIAGNOSES   Final diagnoses:  Strain of lumbar region, initial encounter  Bulging lumbar disc     Rx / DC Orders   ED Discharge Orders          Ordered    HYDROcodone-acetaminophen (NORCO/VICODIN) 5-325 MG tablet  Every 4 hours PRN        05/24/22 1932    meloxicam (MOBIC) 15 MG tablet  Daily        05/24/22 1932    methocarbamol (ROBAXIN) 500 MG tablet  4 times daily        05/24/22 1932    methylPREDNISolone (MEDROL DOSEPAK) 4 MG TBPK tablet        05/24/22 1932             Note:  This document was prepared using  [employer] and may include unintentional dictation errors.   Lanette Hampshire 05/24/22 1933    Chesley Noon, MD 05/25/22 [phone]

## 2022-05-28 ENCOUNTER — Other Ambulatory Visit: Payer: Self-pay

## 2022-05-28 ENCOUNTER — Emergency Department
Admission: EM | Admit: 2022-05-28 | Discharge: 2022-05-28 | Disposition: A | Discharge status: Home / Self Care | Payer: Self-pay | Attending: Emergency Medicine | Admitting: Emergency Medicine

## 2022-05-28 ENCOUNTER — Encounter: Payer: Self-pay | Admitting: Emergency Medicine

## 2022-05-28 DIAGNOSIS — M545 Low back pain, unspecified: Secondary | ICD-10-CM | POA: Insufficient documentation

## 2022-05-28 MED ORDER — NAPROXEN 500 MG PO TABS: 500.0000 mg | ORAL_TABLET | Freq: Two times a day (BID) | ORAL | 0 refills | Status: AC

## 2022-05-28 MED ORDER — LIDOCAINE 5 % EX PTCH: 1.0000 | MEDICATED_PATCH | Freq: Two times a day (BID) | CUTANEOUS | 0 refills | Status: AC

## 2022-05-28 NOTE — Discharge Instructions (Addendum)
You can take the naproxen instead of the meloxicam.  Do not take with any other NSAIDs.  You have also been referred to physical therapy.  You can also follow-up with the back doctor listed above. Please return to the emergency department for any new, worsening, or changing symptoms or other concerns including weakness in your legs, urinary or stool incontinence or retention, numbness or tingling in your extremities/buttocks/groin, fevers, or any other concerns or change in symptoms.

## 2022-05-28 NOTE — ED Notes (Signed)
See triage note  States he was seen last week with back pain  States he has been taking the meds  Cont's to have pain  States he is not able to lift

## 2022-05-28 NOTE — ED Provider Notes (Signed)
[facility] Provider Note    Event Date/Time   First MD Initiated Contact with Patient 05/28/22 1201     (approximate)   History   Back Pain   HPI  Logan King is a 32 y.o. male who presents today for evaluation of back pain.  Patient reports that his medications that he was prescribed during his previous visit are not helping.  Patient reports that he is post to go back to work tomorrow but he does not want to make his current situation worse.  He is wondering what else he can do to help with his pain.  He has been taking the medications as prescribed with the exception of the Vicodin which she did not want to take.  He denies any new symptoms today.  He has had any urinary or fecal incontinence or retention.  He is able to ambulate.  Patient Active Problem List   Diagnosis Date Noted   Marijuana smoker 06/10/2014   Chest pain 06/09/2014          Physical Exam   Triage Vital Signs: ED Triage Vitals [05/28/22 1128]  Enc Vitals Group     BP (!) 142/88     Pulse Rate 83     Resp 16     Temp 98.3 F (36.8 C)     Temp Source Oral     SpO2 98 %     Weight 225 lb (102.1 kg)     Height 5\' 9"  (1.753 m)     Head Circumference      Peak Flow      Pain Score 9     Pain Loc      Pain Edu?      Excl. in GC?     Most recent vital signs: Vitals:   05/28/22 1128  BP: (!) 142/88  Pulse: 83  Resp: 16  Temp: 98.3 F (36.8 C)  SpO2: 98%    Physical Exam Vitals and nursing note reviewed.  Constitutional:      General: Awake and alert. No acute distress.    Appearance: Normal appearance. The patient is normal weight.  HENT:     Head: Normocephalic and atraumatic.     Mouth: Mucous membranes are moist.  Eyes:     General: PERRL. Normal EOMs        Right eye: No discharge.        Left eye: No discharge.     Conjunctiva/sclera: Conjunctivae normal.  Cardiovascular:     Rate and Rhythm: Normal rate and regular rhythm.     Pulses:  Normal pulses.     Heart sounds: Normal heart sounds Pulmonary:     Effort: Pulmonary effort is normal. No respiratory distress.     Breath sounds: Normal breath sounds.  Abdominal:     Abdomen is soft. There is no abdominal tenderness. No rebound or guarding. No distention. Musculoskeletal:        General: No swelling. Normal range of motion.     Cervical back: Normal range of motion and neck supple.  Back: No midline tenderness.  Tenderness to bilateral lumbar paraspinal muscles.  Strength and sensation 5/5 to bilateral lower extremities. Normal great toe extension against resistance. Normal sensation throughout feet. Normal patellar reflexes.  Discomfort with SLR and opposite SLR bilaterally. Negative FABER test Skin:    General: Skin is warm and dry.     Capillary Refill: Capillary refill takes less than 2 seconds.  Findings: No rash.  Neurological:     Mental Status: The patient is awake and alert.      ED Results / Procedures / Treatments   Labs (all labs ordered are listed, but only abnormal results are displayed) Labs Reviewed - No data to display   EKG     RADIOLOGY     PROCEDURES:  Critical Care performed:   Procedures   MEDICATIONS ORDERED IN ED: Medications - No data to display   IMPRESSION / MDM / ASSESSMENT AND PLAN / ED COURSE  I reviewed the triage vital signs and the nursing notes.   Differential diagnosis includes, but is not limited to, lumbar strain, disc herniation, radiculopathy.  I reviewed the patient's chart.  Patient was seen in the emergency department on 05/24/2022 at which time he was lifting a heavy tire and felt pain in his low back.  He had a CT of his lumbar spine at that time which showed stenosis at L3-L4, L4-L5, and L5-S1 with mild multilevel degenerative disc disease at the same levels.  He was treated with prednisone, meloxicam, Robaxin, and like given at that time.  He was discharged on the same medications.  Patient  presents emergency department awake and alert, hemodynamically stable and afebrile. He has 5 out of 5 strength with intact sensation to extensor hallucis dorsiflexion and plantarflexion of bilateral lower extremities with normal patellar reflexes bilaterally. Most likely etiology at this point is muscle strain vs herniated disc. No red flags to indicate patient is at risk for more auspicious process that would require additional urgent/emergent spinal imaging or subspecialty evaluation at this time, as he just had a CT lumbar spine 3 days ago and there are no history or physical exam findings to suggest cauda equina syndrome or spinal cord compression. No focal neurological deficits on exam. No constitutional symptoms or history of immunosuppression or IVDA to suggest potential for epidural abscess. Not anticoagulated, no history of bleeding diastasis to suggest risk for epidural hematoma. No chronic steroid use or advanced age or history of malignancy to suggest proclivity towards pathological fracture since recent scan.  No abdominal pain or flank pain to suggest kidney stone, no history of kidney stone.  No fever or dysuria or CVAT to suggest pyelonephritis .  No chest pain, back pain, shortness of breath, neurological deficits, to suggest vascular catastrophe, and pulses are equal in all 4 extremities.    I do think that physical therapy may benefit this patient, and he was referred to PT. He was also given the information for neurosurgery/spine surgery for follow up. He was given Naproxen instead of mobic, instructed to discontinue the mobic if he takes the naproxen and not to take with any other NSAID. Also given lidoderm patches.   Discussed care instructions and return precautions with patient. Recommended close outpatient follow-up for re-evaluation. Patient agrees with plan of care. Will discharge patient to take these medications and return for any worsening or different pain or development of any  neurologic symptoms. Educated patient regarding expected time course for back pain to improve and recommended very close outpatient follow-up. He was given alight duty work note as requested. Discharged in stable condition and ambulatory with a steady gait.   Patient's presentation is most consistent with exacerbation of chronic illness.    FINAL CLINICAL IMPRESSION(S) / ED DIAGNOSES   Final diagnoses:  Acute bilateral low back pain without sciatica     Rx / DC Orders   ED Discharge Orders  Ordered    Ambulatory referral to Physical Therapy        05/28/22 1215    naproxen (NAPROSYN) 500 MG tablet  2 times daily with meals        05/28/22 1215    lidocaine (LIDODERM) 5 %  Every 12 hours        05/28/22 1215             Note:  This document was prepared using Dragon voice recognition software and may include unintentional dictation errors.   Keturah Shavers 05/28/22 1441    Chesley Noon, MD 05/28/22 1451

## 2022-05-28 NOTE — ED Triage Notes (Signed)
Pt was seen 3 days ago and told he has 3 bulging disc in his back. Pt was sent home on medication but states it is not helping with the pain. Pt is needing documentation for work because he is unable to lift things.
